# Patient Record
Sex: Female | Born: 1935
Health system: Southern US, Community
[De-identification: ages and names within clinical notes are randomized; demographics above are authoritative.]

## PROBLEM LIST (undated history)

## (undated) DIAGNOSIS — M1990 Unspecified osteoarthritis, unspecified site: Secondary | ICD-10-CM

## (undated) DIAGNOSIS — G459 Transient cerebral ischemic attack, unspecified: Secondary | ICD-10-CM

## (undated) DIAGNOSIS — I10 Essential (primary) hypertension: Secondary | ICD-10-CM

## (undated) DIAGNOSIS — H409 Unspecified glaucoma: Secondary | ICD-10-CM

## (undated) DIAGNOSIS — T8859XA Other complications of anesthesia, initial encounter: Secondary | ICD-10-CM

## (undated) DIAGNOSIS — T4145XA Adverse effect of unspecified anesthetic, initial encounter: Secondary | ICD-10-CM

## (undated) DIAGNOSIS — E785 Hyperlipidemia, unspecified: Secondary | ICD-10-CM

## (undated) HISTORY — PX: TONSILLECTOMY: SUR1361

---

## 2000-05-03 ENCOUNTER — Ambulatory Visit (HOSPITAL_COMMUNITY): Admission: RE | Admit: 2000-05-03 | Discharge: 2000-05-03 | Payer: Self-pay | Admitting: Family Medicine

## 2000-05-03 ENCOUNTER — Encounter: Payer: Self-pay | Admitting: Family Medicine

## 2000-05-04 ENCOUNTER — Other Ambulatory Visit: Admission: RE | Admit: 2000-05-04 | Discharge: 2000-05-04 | Payer: Self-pay | Admitting: Family Medicine

## 2004-08-16 ENCOUNTER — Encounter (HOSPITAL_COMMUNITY): Admission: RE | Admit: 2004-08-16 | Discharge: 2004-09-15 | Payer: Self-pay | Admitting: Neurological Surgery

## 2007-01-04 HISTORY — PX: EYE SURGERY: SHX253

## 2007-02-06 ENCOUNTER — Ambulatory Visit (HOSPITAL_COMMUNITY): Admission: RE | Admit: 2007-02-06 | Discharge: 2007-02-07 | Payer: Self-pay | Admitting: Ophthalmology

## 2010-05-18 NOTE — Op Note (Signed)
NAME:  JIANNA, DRABIK           ACCOUNT NO.:  [id number]   MEDICAL RECORD NO.:  [id number]          PATIENT TYPE:  AMB   LOCATION:  SDS                          FACILITY:  [facility]   PHYSICIAN:  John D. Ashley Royalty, M.D. DATE OF BIRTH:  [birth year]   DATE OF PROCEDURE:  02/06/2007  DATE OF DISCHARGE:                               OPERATIVE REPORT   ADMISSION DIAGNOSIS:  Preretinal fibrosis, right eye, and macular edema,  right eye.   PROCEDURES:  Pars plana vitrectomy, right eye, retinal photocoagulation,  right eye, intravitreal Kenalog injection, right eye, membrane peel,  right eye.   SURGEON:  Beulah Gandy. Ashley Royalty, M.D.   ASSISTANT:  Rosalie Doctor, MA   ANESTHESIA:  General.   DETAILS:  Usual prep and drape. The indirect ophthalmoscope laser was  moved into place. Two rows of laser were placed in the retinal periphery  with a power of 320 milliwatts, 398 burns were placed, 1000 microns each  and 0.1 seconds each.  Attention was carried to the pars plana area  where sclerotomies were made at 8, 10 and 2 o'clock.  The 5 mm infusion  port was anchored into place at 8 o'clock. The lighted pick and the  cutter placed at 10 and 2 o'clock respectively.  Contact lens ring was  anchored into place at 6 and 12 o'clock.  Provisc was placed on the  corneal surface and flat contact lens was placed.  Pars plana vitrectomy  was begun just behind the crystalline lens.  Large sheets of vitreous  material were encountered and carefully removed with the vitreous  cutter, the diamond dusted membrane scraper, the retinal pick, and ILM  forceps.  The diamond dusted membrane scraper was used to sweep around  the macular region.  All surface membranes were removed.  The membrane  was removed from its attachment to the disk. The vitrectomy was carried  out to the far periphery with a 30 degrees prismatic lens. Vitreous was  trimmed down to the vitreous base for 360 degrees. All particles were  removed from  the vitreous then intravitreal Kenalog, 0.1 mL, was  injected into the vitreous cavity.  The instruments were removed from  the eye and 9-0 nylon was used to close the sclerotomy sites.  The  conjunctiva was closed with wet field cautery.  Polymyxin and gentamicin  were irrigated into tenon's space.  Marcaine was injected around the  globe for postop pain. Decadron 10 mg was injected to the lower  subconjunctival space.  Closing pressure was 15 with a Museum/gallery exhibitions officer.  TobraDex ophthalmic ointment, a patch and shield were  placed.  The patient was awakened and taken to recovery in satisfactory  condition.   COMPLICATIONS:  None.   DURATION:  One hour.     Beulah Gandy. Ashley Royalty, M.D.  Electronically Signed    JDM/MEDQ  D:  02/06/2007  T:  02/06/2007  Job:  [id number]

## 2010-09-24 LAB — CBC
HCT: 38.1
Hemoglobin: 13.1
MCHC: 34.5
MCV: 89.1
Platelets: 290
RBC: 4.27
RDW: 12.9
WBC: 6.8

## 2010-09-24 LAB — BASIC METABOLIC PANEL
BUN: 13
CO2: 29
Calcium: 9.5
Chloride: 105
Creatinine, Ser: 0.69
GFR calc Af Amer: >60
GFR calc non Af Amer: >60
Glucose, Bld: 101 — ABNORMAL HIGH
Potassium: 4.2
Sodium: 140

## 2011-03-24 ENCOUNTER — Encounter (INDEPENDENT_AMBULATORY_CARE_PROVIDER_SITE_OTHER): Payer: Medicare Other | Admitting: Ophthalmology

## 2011-03-24 DIAGNOSIS — H35379 Puckering of macula, unspecified eye: Secondary | ICD-10-CM

## 2011-03-24 DIAGNOSIS — H3530 Unspecified macular degeneration: Secondary | ICD-10-CM

## 2011-03-24 DIAGNOSIS — H43819 Vitreous degeneration, unspecified eye: Secondary | ICD-10-CM

## 2011-05-05 ENCOUNTER — Encounter (INDEPENDENT_AMBULATORY_CARE_PROVIDER_SITE_OTHER): Payer: Medicare Other | Admitting: Ophthalmology

## 2011-05-05 DIAGNOSIS — H43819 Vitreous degeneration, unspecified eye: Secondary | ICD-10-CM

## 2011-05-05 DIAGNOSIS — H35379 Puckering of macula, unspecified eye: Secondary | ICD-10-CM

## 2011-05-05 DIAGNOSIS — H35359 Cystoid macular degeneration, unspecified eye: Secondary | ICD-10-CM

## 2011-05-18 ENCOUNTER — Ambulatory Visit (INDEPENDENT_AMBULATORY_CARE_PROVIDER_SITE_OTHER): Payer: Medicare Other | Admitting: Ophthalmology

## 2011-05-18 DIAGNOSIS — H26499 Other secondary cataract, unspecified eye: Secondary | ICD-10-CM

## 2011-06-01 ENCOUNTER — Ambulatory Visit (INDEPENDENT_AMBULATORY_CARE_PROVIDER_SITE_OTHER): Payer: Medicare Other | Admitting: Ophthalmology

## 2011-06-01 DIAGNOSIS — H2700 Aphakia, unspecified eye: Secondary | ICD-10-CM

## 2011-07-18 ENCOUNTER — Encounter (INDEPENDENT_AMBULATORY_CARE_PROVIDER_SITE_OTHER): Payer: Medicare Other | Admitting: Ophthalmology

## 2011-07-18 DIAGNOSIS — H35359 Cystoid macular degeneration, unspecified eye: Secondary | ICD-10-CM

## 2011-07-18 DIAGNOSIS — H3530 Unspecified macular degeneration: Secondary | ICD-10-CM

## 2011-07-18 DIAGNOSIS — H43819 Vitreous degeneration, unspecified eye: Secondary | ICD-10-CM

## 2011-07-18 DIAGNOSIS — H35379 Puckering of macula, unspecified eye: Secondary | ICD-10-CM

## 2011-08-01 ENCOUNTER — Other Ambulatory Visit (HOSPITAL_COMMUNITY): Payer: Self-pay | Admitting: Pediatrics

## 2011-08-01 DIAGNOSIS — G459 Transient cerebral ischemic attack, unspecified: Secondary | ICD-10-CM

## 2011-08-02 ENCOUNTER — Ambulatory Visit (HOSPITAL_COMMUNITY)
Admission: RE | Admit: 2011-08-02 | Discharge: 2011-08-02 | Disposition: A | Discharge status: Home / Self Care | Payer: Medicare Other | Source: Ambulatory Visit | Attending: Pediatrics | Admitting: Pediatrics

## 2011-08-02 DIAGNOSIS — G459 Transient cerebral ischemic attack, unspecified: Secondary | ICD-10-CM | POA: Insufficient documentation

## 2011-08-02 DIAGNOSIS — I6529 Occlusion and stenosis of unspecified carotid artery: Secondary | ICD-10-CM | POA: Insufficient documentation

## 2011-09-19 ENCOUNTER — Encounter (INDEPENDENT_AMBULATORY_CARE_PROVIDER_SITE_OTHER): Payer: Medicare Other | Admitting: Ophthalmology

## 2011-09-19 DIAGNOSIS — H3530 Unspecified macular degeneration: Secondary | ICD-10-CM

## 2011-09-19 DIAGNOSIS — H35359 Cystoid macular degeneration, unspecified eye: Secondary | ICD-10-CM

## 2011-09-19 DIAGNOSIS — H43819 Vitreous degeneration, unspecified eye: Secondary | ICD-10-CM

## 2011-09-20 ENCOUNTER — Encounter (HOSPITAL_BASED_OUTPATIENT_CLINIC_OR_DEPARTMENT_OTHER): Payer: Self-pay | Admitting: *Deleted

## 2011-09-20 NOTE — Progress Notes (Signed)
To come in for [id number]  

## 2011-09-23 ENCOUNTER — Encounter (HOSPITAL_BASED_OUTPATIENT_CLINIC_OR_DEPARTMENT_OTHER)
Admission: RE | Admit: 2011-09-23 | Discharge: 2011-09-23 | Disposition: A | Discharge status: Home / Self Care | Payer: Medicare Other | Source: Ambulatory Visit | Attending: Orthopedic Surgery | Admitting: Orthopedic Surgery

## 2011-09-23 ENCOUNTER — Other Ambulatory Visit: Payer: Self-pay | Admitting: Orthopedic Surgery

## 2011-09-23 LAB — BASIC METABOLIC PANEL
CO2: 26 mEq/L (ref 19–32)
Calcium: 9.3 mg/dL (ref 8.4–10.5)
Creatinine, Ser: 0.63 mg/dL (ref 0.50–1.10)
Glucose, Bld: 97 mg/dL (ref 70–99)

## 2011-09-27 ENCOUNTER — Encounter (HOSPITAL_BASED_OUTPATIENT_CLINIC_OR_DEPARTMENT_OTHER)
Admission: RE | Disposition: A | Discharge status: Home / Self Care | Payer: Self-pay | Source: Ambulatory Visit | Attending: Orthopedic Surgery

## 2011-09-27 ENCOUNTER — Ambulatory Visit (HOSPITAL_BASED_OUTPATIENT_CLINIC_OR_DEPARTMENT_OTHER)
Admission: RE | Admit: 2011-09-27 | Discharge: 2011-09-27 | Disposition: A | Discharge status: Home / Self Care | Payer: Medicare Other | Source: Ambulatory Visit | Attending: Orthopedic Surgery | Admitting: Orthopedic Surgery

## 2011-09-27 ENCOUNTER — Encounter (HOSPITAL_BASED_OUTPATIENT_CLINIC_OR_DEPARTMENT_OTHER): Payer: Self-pay | Admitting: Orthopedic Surgery

## 2011-09-27 ENCOUNTER — Encounter (HOSPITAL_BASED_OUTPATIENT_CLINIC_OR_DEPARTMENT_OTHER): Payer: Self-pay | Admitting: Certified Registered"

## 2011-09-27 ENCOUNTER — Encounter (HOSPITAL_BASED_OUTPATIENT_CLINIC_OR_DEPARTMENT_OTHER): Payer: Self-pay | Admitting: Certified Registered Nurse Anesthetist

## 2011-09-27 ENCOUNTER — Ambulatory Visit (HOSPITAL_BASED_OUTPATIENT_CLINIC_OR_DEPARTMENT_OTHER): Payer: Medicare Other | Admitting: Certified Registered"

## 2011-09-27 DIAGNOSIS — I10 Essential (primary) hypertension: Secondary | ICD-10-CM | POA: Insufficient documentation

## 2011-09-27 DIAGNOSIS — M65839 Other synovitis and tenosynovitis, unspecified forearm: Secondary | ICD-10-CM | POA: Insufficient documentation

## 2011-09-27 DIAGNOSIS — Z8673 Personal history of transient ischemic attack (TIA), and cerebral infarction without residual deficits: Secondary | ICD-10-CM | POA: Insufficient documentation

## 2011-09-27 DIAGNOSIS — E785 Hyperlipidemia, unspecified: Secondary | ICD-10-CM | POA: Insufficient documentation

## 2011-09-27 DIAGNOSIS — Z01812 Encounter for preprocedural laboratory examination: Secondary | ICD-10-CM | POA: Insufficient documentation

## 2011-09-27 DIAGNOSIS — Z01810 Encounter for preprocedural cardiovascular examination: Secondary | ICD-10-CM | POA: Insufficient documentation

## 2011-09-27 DIAGNOSIS — M6530 Trigger finger, unspecified finger: Secondary | ICD-10-CM | POA: Insufficient documentation

## 2011-09-27 DIAGNOSIS — M65849 Other synovitis and tenosynovitis, unspecified hand: Secondary | ICD-10-CM | POA: Insufficient documentation

## 2011-09-27 HISTORY — DX: Essential (primary) hypertension: I10

## 2011-09-27 HISTORY — DX: Other complications of anesthesia, initial encounter: T88.59XA

## 2011-09-27 HISTORY — DX: Hyperlipidemia, unspecified: E78.5

## 2011-09-27 HISTORY — DX: Adverse effect of unspecified anesthetic, initial encounter: T41.45XA

## 2011-09-27 HISTORY — DX: Unspecified osteoarthritis, unspecified site: M19.90

## 2011-09-27 HISTORY — PX: TRIGGER FINGER RELEASE: SHX641

## 2011-09-27 HISTORY — DX: Transient cerebral ischemic attack, unspecified: G45.9

## 2011-09-27 HISTORY — DX: Unspecified glaucoma: H40.9

## 2011-09-27 SURGERY — RELEASE, A1 PULLEY, FOR TRIGGER FINGER
Anesthesia: Regional | Site: Finger | Laterality: Left | Wound class: Clean

## 2011-09-27 MED ORDER — BUPIVACAINE HCL (PF) 0.25 % IJ SOLN
INTRAMUSCULAR | Status: DC | PRN
  Administered 2011-09-27: 5 mL

## 2011-09-27 MED ORDER — HYDROMORPHONE HCL PF 1 MG/ML IJ SOLN: 0.2500 mg | INTRAMUSCULAR | Status: DC | PRN

## 2011-09-27 MED ORDER — HYDROCODONE-ACETAMINOPHEN 5-500 MG PO TABS: 1.0000 | ORAL_TABLET | ORAL | Status: AC | PRN

## 2011-09-27 MED ORDER — LIDOCAINE HCL (CARDIAC) 20 MG/ML IV SOLN
INTRAVENOUS | Status: DC | PRN
  Administered 2011-09-27: 30 mg via INTRAVENOUS

## 2011-09-27 MED ORDER — ONDANSETRON HCL 4 MG/2ML IJ SOLN
INTRAMUSCULAR | Status: DC | PRN
  Administered 2011-09-27: 4 mg via INTRAVENOUS

## 2011-09-27 MED ORDER — OXYCODONE HCL 5 MG/5ML PO SOLN: 5.0000 mg | Freq: Once | ORAL | Status: DC | PRN

## 2011-09-27 MED ORDER — OXYCODONE HCL 5 MG PO TABS: 5.0000 mg | ORAL_TABLET | Freq: Once | ORAL | Status: DC | PRN

## 2011-09-27 MED ORDER — CEFAZOLIN SODIUM-DEXTROSE 2-3 GM-% IV SOLR
2.0000 g | INTRAVENOUS | Status: AC
  Administered 2011-09-27: 2 g via INTRAVENOUS

## 2011-09-27 MED ORDER — DROPERIDOL 2.5 MG/ML IJ SOLN: 0.6250 mg | INTRAMUSCULAR | Status: DC | PRN

## 2011-09-27 MED ORDER — PROPOFOL 10 MG/ML IV EMUL
INTRAVENOUS | Status: DC | PRN
  Administered 2011-09-27: 25 ug/kg/min via INTRAVENOUS

## 2011-09-27 MED ORDER — CHLORHEXIDINE GLUCONATE 4 % EX LIQD: 60.0000 mL | Freq: Once | CUTANEOUS | Status: DC

## 2011-09-27 MED ORDER — LIDOCAINE HCL (PF) 0.5 % IJ SOLN
INTRAMUSCULAR | Status: DC | PRN
  Administered 2011-09-27: 30 mL via INTRAVENOUS

## 2011-09-27 MED ORDER — LACTATED RINGERS IV SOLN
INTRAVENOUS | Status: DC
  Administered 2011-09-27: 09:00:00 via INTRAVENOUS

## 2011-09-27 SURGICAL SUPPLY — 38 items
BANDAGE COBAN STERILE 2 (GAUZE/BANDAGES/DRESSINGS) ×2 IMPLANT
BLADE SURG 15 STRL LF DISP TIS (BLADE) ×1 IMPLANT
BLADE SURG 15 STRL SS (BLADE) ×2
BNDG CMPR 9X4 STRL LF SNTH (GAUZE/BANDAGES/DRESSINGS)
BNDG ESMARK 4X9 LF (GAUZE/BANDAGES/DRESSINGS) IMPLANT
CHLORAPREP W/TINT 26ML (MISCELLANEOUS) ×2 IMPLANT
CLOTH BEACON ORANGE TIMEOUT ST (SAFETY) ×2 IMPLANT
CORDS BIPOLAR (ELECTRODE) IMPLANT
COVER MAYO STAND STRL (DRAPES) ×2 IMPLANT
COVER TABLE BACK 60X90 (DRAPES) ×2 IMPLANT
CUFF TOURNIQUET SINGLE 18IN (TOURNIQUET CUFF) ×1 IMPLANT
DECANTER SPIKE VIAL GLASS SM (MISCELLANEOUS) IMPLANT
DRAPE EXTREMITY T 121X128X90 (DRAPE) ×2 IMPLANT
DRAPE SURG 17X23 STRL (DRAPES) ×2 IMPLANT
GAUZE XEROFORM 1X8 LF (GAUZE/BANDAGES/DRESSINGS) ×2 IMPLANT
GLOVE BIO SURGEON STRL SZ 6.5 (GLOVE) ×2 IMPLANT
GLOVE BIO SURGEON STRL SZ7.5 (GLOVE) ×1 IMPLANT
GLOVE BIOGEL PI IND STRL 8 (GLOVE) IMPLANT
GLOVE BIOGEL PI IND STRL 8.5 (GLOVE) IMPLANT
GLOVE BIOGEL PI INDICATOR 8 (GLOVE) ×1
GLOVE BIOGEL PI INDICATOR 8.5 (GLOVE) ×1
GLOVE INDICATOR 7.0 STRL GRN (GLOVE) ×1 IMPLANT
GLOVE SURG ORTHO 8.0 STRL STRW (GLOVE) ×2 IMPLANT
GOWN BRE IMP PREV XXLGXLNG (GOWN DISPOSABLE) ×3 IMPLANT
GOWN PREVENTION PLUS XLARGE (GOWN DISPOSABLE) ×2 IMPLANT
NEEDLE 27GAX1X1/2 (NEEDLE) ×1 IMPLANT
NS IRRIG 1000ML POUR BTL (IV SOLUTION) ×2 IMPLANT
PACK BASIN DAY SURGERY FS (CUSTOM PROCEDURE TRAY) ×2 IMPLANT
PADDING CAST ABS 4INX4YD NS (CAST SUPPLIES)
PADDING CAST ABS COTTON 4X4 ST (CAST SUPPLIES) ×1 IMPLANT
SPONGE GAUZE 4X4 12PLY (GAUZE/BANDAGES/DRESSINGS) ×2 IMPLANT
STOCKINETTE 4X48 STRL (DRAPES) ×2 IMPLANT
SUT VICRYL RAPIDE 4/0 PS 2 (SUTURE) ×2 IMPLANT
SYR BULB 3OZ (MISCELLANEOUS) ×2 IMPLANT
SYR CONTROL 10ML LL (SYRINGE) ×1 IMPLANT
TOWEL OR 17X24 6PK STRL BLUE (TOWEL DISPOSABLE) ×4 IMPLANT
UNDERPAD 30X30 INCONTINENT (UNDERPADS AND DIAPERS) ×2 IMPLANT
WATER STERILE IRR 1000ML POUR (IV SOLUTION) ×1 IMPLANT

## 2011-09-27 NOTE — Transfer of Care (Signed)
Immediate Anesthesia Transfer of Care Note  Patient: Theresa Morris  Procedure(s) Performed: Procedure(s) (LRB) with comments: RELEASE TRIGGER FINGER/A-1 PULLEY (Left) - Left middle finger  Patient Location: PACU  Anesthesia Type: Bier block  Level of Consciousness: awake, alert , oriented and patient cooperative  Airway & Oxygen Therapy: Patient Spontanous Breathing and Patient connected to face mask oxygen  Post-op Assessment: Report given to PACU RN and Post -op Vital signs reviewed and stable  Post vital signs: Reviewed and stable  Complications: No apparent anesthesia complications

## 2011-09-27 NOTE — Anesthesia Procedure Notes (Signed)
Procedure Name: MAC Date/Time: 09/27/2011 9:32 AM Performed by: Braydan Marriott D Pre-anesthesia Checklist: Patient identified, Emergency Drugs available, Suction available, Patient being monitored and Timeout performed Patient Re-evaluated:Patient Re-evaluated prior to inductionOxygen Delivery Method: Simple face mask

## 2011-09-27 NOTE — H&P (Signed)
Theresa Morris is a 78 year-old left-hand dominant female who comes in complaining of catching of her left middle finger.  This has been going on for approximately 8 months.  She did a great deal of pruning in June.  She noticed it approximately two weeks later.  She has no history of diabetes, thyroid problems, arthritis or gout.  She is not complaining of any numbness or tingling. She is not actually complaining of any significant pain or discomfort. She has had recurrence of her STS left middle finger. She recently had a TIA and was placed on aspirin. She apparently did not need a carotid endarterectomy.   ALLERGIES: None.    MEDICATIONS:   None.  SURGICAL HISTORY:   Eye surgery in 2008.     FAMILY MEDICAL HISTORY:    Negative.  SOCIAL HISTORY:   She does not smoke or drink.   She is married. She is a retired Runner, broadcasting/film/video.  REVIEW OF SYSTEMS:     Negative 14 points.   Theresa Morris is an 78 y.o. female.   Chief Complaint: STS LMF HPI: see above  Past Medical History  Diagnosis Date  . Hypertension   . Hyperlipemia   . Glaucoma   . TIA (transient ischemic attack)     had a finger go limp-was told tia.  . Arthritis   . Complication of anesthesia     was told she had a small airway    Past Surgical History  Procedure Date  . Eye surgery 2009    rt eye  . Tonsillectomy   . Cesarean section     No family history on file. Social History:  reports that she has never smoked. She does not have any smokeless tobacco history on file. She reports that she does not drink alcohol or use illicit drugs.  Allergies: No Known Allergies  Medications Prior to Admission  Medication Sig Dispense Refill  . aspirin 325 MG tablet Take 325 mg by mouth daily.      Marland Kitchen lisinopril-hydrochlorothiazide (PRINZIDE,ZESTORETIC) 10-12.5 MG per tablet Take 1 tablet by mouth daily.      . Multiple Vitamins-Minerals (MULTIVITAMIN WITH MINERALS) tablet Take 1 tablet by mouth daily.      . pravastatin  (PRAVACHOL) 40 MG tablet Take 40 mg by mouth daily.        No results found for this or any previous visit (from the past 48 hour(s)).  No results found.   Pertinent items are noted in HPI.  Height 5\' 1"  (1.549 m), weight 62.143 kg (137 lb).  General appearance: alert, cooperative and appears stated age Head: Normocephalic, without obvious abnormality Neck: no adenopathy Resp: clear to auscultation bilaterally Cardio: regular rate and rhythm, S1, S2 normal, no murmur, click, rub or gallop GI: soft, non-tender; bowel sounds normal; no masses,  no organomegaly Extremities: extremities normal, atraumatic, no cyanosis or edema Pulses: 2+ and symmetric Skin: Skin color, texture, turgor normal. No rashes or lesions Neurologic: Grossly normal Incision/Wound: na  Assessment/Plan DX STS LMF  She desires proceeding to have the A-1 pulley released. The pre, peri and post op course are discussed along with risks and complications.  She is aware there is no guarantee with surgery, possibility of infection, recurrence, injury to arteries, nerves and tendons, incomplete relief of symptoms and dystrophy.  She is scheduled for release A-1 pulley left middle finger as an outpatient. This will be done under forearm based IV regional or local infiltration.  Ashantia Amaral R 09/27/2011, 8:38 AM

## 2011-09-27 NOTE — Brief Op Note (Signed)
09/27/2011  10:01 AM  PATIENT:  Theresa Morris  78 y.o. female  PRE-OPERATIVE DIAGNOSIS:  stenosing tenosynovitis left middle finger  POST-OPERATIVE DIAGNOSIS:  stenosing tenosynovitis left middle finger  PROCEDURE:  Procedure(s) (LRB) with comments: RELEASE TRIGGER FINGER/A-1 PULLEY (Left) - Left middle finger  SURGEON:  Surgeon(s) and Role:    * Nicki Reaper, MD - Primary  PHYSICIAN ASSISTANT:   ASSISTANTS: Karlyn Agee, MD   ANESTHESIA:   local and regional  EBL:  Total I/O In: 500 [I.V.:500] Out: -   BLOOD ADMINISTERED:none  DRAINS: none   LOCAL MEDICATIONS USED:  MARCAINE     SPECIMEN:  No Specimen  DISPOSITION OF SPECIMEN:  N/A  COUNTS:  YES  TOURNIQUET:   Total Tourniquet Time Documented: Forearm (Left) - 15 minutes  DICTATION: .Other Dictation: Dictation Number [id number]  PLAN OF CARE: Discharge to home after PACU  PATIENT DISPOSITION:  PACU - hemodynamically stable.

## 2011-09-27 NOTE — Anesthesia Preprocedure Evaluation (Addendum)
Anesthesia Evaluation  Patient identified by MRN, date of birth, ID band Patient awake    Reviewed: Allergy & Precautions, H&P , NPO status , Patient's Chart, lab work & pertinent test results  History of Anesthesia Complications (+) DIFFICULT AIRWAY  Airway Mallampati: I  Neck ROM: Full    Dental  (+) Dental Advisory Given   Pulmonary neg pulmonary ROS,  breath sounds clear to auscultation  Pulmonary exam normal       Cardiovascular hypertension, Pt. on medications Rhythm:Regular     Neuro/Psych TIA   GI/Hepatic negative GI ROS, Neg liver ROS,   Endo/Other  negative endocrine ROS  Renal/GU negative Renal ROS     Musculoskeletal   Abdominal   Peds  Hematology   Anesthesia Other Findings   Reproductive/Obstetrics                           Anesthesia Physical Anesthesia Plan  ASA: III  Anesthesia Plan: MAC and Bier Block   Post-op Pain Management:    Induction:   Airway Management Planned: Simple Face Mask  Additional Equipment:   Intra-op Plan:   Post-operative Plan:   Informed Consent: I have reviewed the patients History and Physical, chart, labs and discussed the procedure including the risks, benefits and alternatives for the proposed anesthesia with the patient or authorized representative who has indicated his/her understanding and acceptance.   Dental advisory given  Plan Discussed with: CRNA, Anesthesiologist and Surgeon  Anesthesia Plan Comments:         Anesthesia Quick Evaluation

## 2011-09-27 NOTE — Op Note (Signed)
Dictation Number [id number]

## 2011-09-27 NOTE — Anesthesia Postprocedure Evaluation (Signed)
Anesthesia Post Note  Patient: Theresa Morris  Procedure(s) Performed: Procedure(s) (LRB): RELEASE TRIGGER FINGER/A-1 PULLEY (Left)  Anesthesia type: MAC  Patient location: PACU  Post pain: Pain level controlled  Post assessment: Patient's Cardiovascular Status Stable  Last Vitals:  Filed Vitals:   09/27/11 1030  BP: 155/61  Pulse: 53  Temp:   Resp: 12    Post vital signs: Reviewed and stable  Level of consciousness: sedated  Complications: No apparent anesthesia complications

## 2011-09-28 ENCOUNTER — Encounter (HOSPITAL_BASED_OUTPATIENT_CLINIC_OR_DEPARTMENT_OTHER): Payer: Self-pay | Admitting: Orthopedic Surgery

## 2011-09-28 NOTE — Op Note (Signed)
NAME:  Theresa Morris, SPATH           ACCOUNT NO.:  [id number]  MEDICAL RECORD NO.:  [id number]  LOCATION:                                 FACILITY:  PHYSICIAN:  Cindee Salt, M.D.       DATE OF BIRTH:  1931/05/30  DATE OF PROCEDURE:  09/27/2011 DATE OF DISCHARGE:                              OPERATIVE REPORT   PREOPERATIVE DIAGNOSIS:  Stenosing tenosynovitis, left middle finger.  POSTOPERATIVE DIAGNOSIS:  Stenosing tenosynovitis, left middle finger.  OPERATION:  Release of A1 pulley, left middle finger.  SURGEON:  Cindee Salt, MD  ASSISTANT:  Betha Loa, MD  ANESTHESIA:  Forearm-based IV regional with local infiltration.  ANESTHESIOLOGIST:  Quita Skye. Krista Blue, MD.  HISTORY:  The patient is a 78 year old female with a history of triggering of her left middle finger and nonresponsive to conservative treatment.  She is elected to undergo surgical release of the A1 pulley. Pre, peri, and postoperative course have been discussed along with risks and complications.  She is aware that there is no guarantee with the surgery, possibility of infection; recurrence of injury to arteries, nerves, tendons, incomplete relief of symptoms, dystrophy.  In the preoperative area, the patient is seen.  The extremity marked by both the patient and surgeon.  Antibiotic given.  PROCEDURE:  The patient was brought to the operating room where a forearm-based IV regional anesthetic was carried out without difficulty. She was prepped using ChloraPrep, supine position, left arm free.  A 3- minute dry time was allowed.  Time-out taken confirming the patient and procedure.  An oblique incision was made over the A1 pulley of the left middle finger, carried down through subcutaneous tissue.  Bleeders were electrocauterized with bipolar.  The dissection was carried down.  The A1 pulley was noted to be quite thickened and this was released on its radial aspect with retractors placed protecting  neurovascular structures.  Small incision was made centrally in A2.  A partial tenosynovectomy performed proximally.  The finger placed through a full range motion, no further triggering was noted.  The wound was irrigated and the skin was closed with interrupted 4-0 Vicryl Rapide sutures. Local infiltration with 0.25% Marcaine without epinephrine was given, 6 mL was used.  A sterile compressive dressing with fingers free was applied.  On deflation of the tourniquet, all fingers immediately pinked.  She was taken to the recovery room for observation in satisfactory condition.  She will be discharged home to return to [facility] in 1 week, on Vicodin.          ______________________________ Cindee Salt, M.D.     GK/MEDQ  D:  09/27/2011  T:  09/28/2011  Job:  [id number]

## 2011-12-19 ENCOUNTER — Encounter (INDEPENDENT_AMBULATORY_CARE_PROVIDER_SITE_OTHER): Payer: Medicare Other | Admitting: Ophthalmology

## 2011-12-19 DIAGNOSIS — H35379 Puckering of macula, unspecified eye: Secondary | ICD-10-CM

## 2011-12-19 DIAGNOSIS — H43819 Vitreous degeneration, unspecified eye: Secondary | ICD-10-CM

## 2011-12-19 DIAGNOSIS — H35359 Cystoid macular degeneration, unspecified eye: Secondary | ICD-10-CM

## 2011-12-19 DIAGNOSIS — H3530 Unspecified macular degeneration: Secondary | ICD-10-CM

## 2012-03-14 ENCOUNTER — Encounter (INDEPENDENT_AMBULATORY_CARE_PROVIDER_SITE_OTHER): Payer: Medicare Other | Admitting: Ophthalmology

## 2012-03-14 DIAGNOSIS — H35359 Cystoid macular degeneration, unspecified eye: Secondary | ICD-10-CM

## 2012-03-14 DIAGNOSIS — H43819 Vitreous degeneration, unspecified eye: Secondary | ICD-10-CM

## 2012-03-14 DIAGNOSIS — H35379 Puckering of macula, unspecified eye: Secondary | ICD-10-CM

## 2012-03-14 DIAGNOSIS — H3530 Unspecified macular degeneration: Secondary | ICD-10-CM

## 2012-03-19 ENCOUNTER — Encounter (INDEPENDENT_AMBULATORY_CARE_PROVIDER_SITE_OTHER): Payer: Medicare Other | Admitting: Ophthalmology

## 2012-06-11 ENCOUNTER — Encounter (INDEPENDENT_AMBULATORY_CARE_PROVIDER_SITE_OTHER): Payer: Medicare Other | Admitting: Ophthalmology

## 2012-06-11 DIAGNOSIS — H43819 Vitreous degeneration, unspecified eye: Secondary | ICD-10-CM

## 2012-06-11 DIAGNOSIS — H35359 Cystoid macular degeneration, unspecified eye: Secondary | ICD-10-CM

## 2012-06-11 DIAGNOSIS — H3530 Unspecified macular degeneration: Secondary | ICD-10-CM

## 2012-06-11 DIAGNOSIS — H35379 Puckering of macula, unspecified eye: Secondary | ICD-10-CM

## 2012-11-12 ENCOUNTER — Encounter (INDEPENDENT_AMBULATORY_CARE_PROVIDER_SITE_OTHER): Payer: Medicare Other | Admitting: Ophthalmology

## 2012-11-12 DIAGNOSIS — H43819 Vitreous degeneration, unspecified eye: Secondary | ICD-10-CM

## 2012-11-12 DIAGNOSIS — H35379 Puckering of macula, unspecified eye: Secondary | ICD-10-CM

## 2012-11-12 DIAGNOSIS — H3530 Unspecified macular degeneration: Secondary | ICD-10-CM

## 2012-11-12 DIAGNOSIS — H35359 Cystoid macular degeneration, unspecified eye: Secondary | ICD-10-CM

## 2013-04-24 ENCOUNTER — Ambulatory Visit (INDEPENDENT_AMBULATORY_CARE_PROVIDER_SITE_OTHER): Payer: Medicare Other | Admitting: Ophthalmology

## 2013-05-03 ENCOUNTER — Ambulatory Visit (INDEPENDENT_AMBULATORY_CARE_PROVIDER_SITE_OTHER): Payer: Medicare Other | Admitting: Ophthalmology

## 2013-05-03 DIAGNOSIS — H35359 Cystoid macular degeneration, unspecified eye: Secondary | ICD-10-CM

## 2013-05-03 DIAGNOSIS — H35379 Puckering of macula, unspecified eye: Secondary | ICD-10-CM

## 2013-05-03 DIAGNOSIS — H3530 Unspecified macular degeneration: Secondary | ICD-10-CM

## 2013-05-03 DIAGNOSIS — H43819 Vitreous degeneration, unspecified eye: Secondary | ICD-10-CM

## 2013-10-23 ENCOUNTER — Encounter: Payer: Self-pay | Admitting: Vascular Surgery

## 2013-10-23 ENCOUNTER — Other Ambulatory Visit: Payer: Self-pay | Admitting: *Deleted

## 2013-10-23 DIAGNOSIS — I70219 Atherosclerosis of native arteries of extremities with intermittent claudication, unspecified extremity: Secondary | ICD-10-CM

## 2013-11-04 ENCOUNTER — Encounter: Payer: Self-pay | Admitting: Family

## 2013-11-05 ENCOUNTER — Encounter (INDEPENDENT_AMBULATORY_CARE_PROVIDER_SITE_OTHER): Payer: Self-pay

## 2013-11-05 ENCOUNTER — Ambulatory Visit (INDEPENDENT_AMBULATORY_CARE_PROVIDER_SITE_OTHER): Payer: Medicare Other | Admitting: Vascular Surgery

## 2013-11-05 ENCOUNTER — Ambulatory Visit (HOSPITAL_COMMUNITY)
Admission: RE | Admit: 2013-11-05 | Discharge: 2013-11-05 | Disposition: A | Discharge status: Home / Self Care | Payer: Medicare Other | Source: Ambulatory Visit | Attending: Vascular Surgery | Admitting: Vascular Surgery

## 2013-11-05 ENCOUNTER — Ambulatory Visit (INDEPENDENT_AMBULATORY_CARE_PROVIDER_SITE_OTHER)
Admission: RE | Admit: 2013-11-05 | Discharge: 2013-11-05 | Disposition: A | Discharge status: Home / Self Care | Payer: Medicare Other | Source: Ambulatory Visit | Attending: Vascular Surgery | Admitting: Vascular Surgery

## 2013-11-05 ENCOUNTER — Encounter: Payer: Self-pay | Admitting: Vascular Surgery

## 2013-11-05 VITALS — BP 164/58 | HR 69 | Resp 18 | Ht 60.5 in | Wt 134.4 lb

## 2013-11-05 DIAGNOSIS — I739 Peripheral vascular disease, unspecified: Secondary | ICD-10-CM | POA: Diagnosis not present

## 2013-11-05 DIAGNOSIS — I70219 Atherosclerosis of native arteries of extremities with intermittent claudication, unspecified extremity: Secondary | ICD-10-CM

## 2013-11-05 NOTE — Addendum Note (Signed)
Addended by: Sharee PimpleMCCHESNEY, Yeudiel Mateo K on: 11/05/2013 04:26 PM   Modules accepted: Orders

## 2013-11-05 NOTE — Progress Notes (Signed)
Patient name: Theresa PigeonFlorene H Hollin MRN: [id number] DOB: 1931-12-19 Sex: female   Referred by: Margo AyeHall  Reason for referral:  Chief Complaint  Patient presents with  . New Evaluation    referred by Dwana MelenaZack Hall   prior abnormal screenings for abis and carotid   . PVD    HISTORY OF PRESENT ILLNESS: Patient presents today for valuation of diffuse peripheral vascular occlusive disease. This is also in follow-up of [facility] screening suggesting possible carotid and possible lower extremity arterial occlusive disease. She is extremely active and very healthy for 80 year old. She does have one event where she reports that her left index finger was week several years ago. She did not seek any evaluation is had no recurrent episodes of this since this one single event. She is left-handed. She specifically denies any other focal deficits and has had no visual or speech changes. She does report that when she is actively walking especially uphill she will have a tired achy sensation in her anterior thighs which is relieved with a very brief resting.. She has no lower extremity tissue loss and no calf claudication symptoms.  Past Medical History  Diagnosis Date  . Hypertension   . Hyperlipemia   . Glaucoma   . TIA (transient ischemic attack)     had a finger go limp-was told tia.  . Arthritis   . Complication of anesthesia     was told she had a small airway    Past Surgical History  Procedure Laterality Date  . Eye surgery  2009    rt eye  . Tonsillectomy    . Cesarean section    . Trigger finger release  09/27/2011    Procedure: RELEASE TRIGGER FINGER/A-1 PULLEY;  Surgeon: Nicki ReaperGary R Kuzma, MD;  Location: [facility];  Service: Orthopedics;  Laterality: Left;  Left middle finger    History   Social History  . Marital Status: Married    Spouse Name: N/A    Number of Children: N/A  . Years of Education: N/A   Occupational History  . Not on file.   Social History Main  Topics  . Smoking status: Never Smoker   . Smokeless tobacco: Not on file  . Alcohol Use: No  . Drug Use: No  . Sexual Activity: Not on file   Other Topics Concern  . Not on file   Social History Narrative    History reviewed. No pertinent family history.  Allergies as of 11/05/2013  . (No Known Allergies)    Current Outpatient Prescriptions on File Prior to Visit  Medication Sig Dispense Refill  . aspirin 325 MG tablet Take 81 mg by mouth daily.     Marland Kitchen. lisinopril-hydrochlorothiazide (PRINZIDE,ZESTORETIC) 10-12.5 MG per tablet Take 1 tablet by mouth daily.    . Multiple Vitamins-Minerals (MULTIVITAMIN WITH MINERALS) tablet Take 1 tablet by mouth daily.    . pravastatin (PRAVACHOL) 40 MG tablet Take 40 mg by mouth daily.     No current facility-administered medications on file prior to visit.     REVIEW OF SYSTEMS:  Positives indicated with an "X"  CARDIOVASCULAR:  [ ]  chest pain   [ ]  chest pressure   [ ]  palpitations   [ ]  orthopnea   [ ]  dyspnea on exertion   [x ] claudication   [ ]  rest pain   [ ]  DVT   [ ]  phlebitis PULMONARY:   [ ]  productive cough   [ ]  asthma   [ ]   wheezing NEUROLOGIC:   [ ]  weakness  [ ]  paresthesias  [ ]  aphasia  [ ]  amaurosis  [ ]  dizziness HEMATOLOGIC:   [ ]  bleeding problems   [ ]  clotting disorders MUSCULOSKELETAL:  [ ]  joint pain   [ ]  joint swelling GASTROINTESTINAL: [ ]   blood in stool  [ ]   hematemesis GENITOURINARY:  [ ]   dysuria  [ ]   hematuria PSYCHIATRIC:  [ ]  history of major depression INTEGUMENTARY:  [ ]  rashes  [ ]  ulcers CONSTITUTIONAL:  [ ]  fever   [ ]  chills  PHYSICAL EXAMINATION:  General: The patient is a well-nourished female, in no acute distress. Vital signs are BP 164/58 mmHg  Pulse 69  Resp 18  Ht 5' 0.5" (1.537 m)  Wt 134 lb 6.4 oz (60.963 kg)  BMI 25.81 kg/m2 Pulmonary: There is a good air exchange bilaterally without wheezing or rales.  Musculoskeletal: There are no major deformities.  There is no  significant extremity pain. Neurologic: No focal weakness or paresthesias are detected, Skin: There are no ulcer or rashes noted. Psychiatric: The patient has normal affect. Cardiovascular: There is a regular rate and rhythm without significant murmur appreciated. Carotid arteries without bruits bilaterally No abdominal bruits noted. 1-2+ femoral and 1+ dorsalis pedis pulses bilaterally  [facility] Vascular Lab Studies:  Ordered and Independently Reviewed carotid duplex revealed 40-59% stenosis of the right internal carotid artery and no significant left carotid stenosis.  Lower extremity Doppler studies reveal Index of 0.7 on the right and 0.70 on the left with biphasic tibial waveforms on the right and monophasic on the left  Impression and Plan:  Diffuse peripheral vascular occlusive disease. I had a long discussion with the patient regarding these issues. Explained that she does not have any indication of high-grade carotid stenosis. Explained that the single event with her left index finger years ago as it is difficult to assess. Since she does not have any high-grade stenosis in the carotid and no symptoms in several years certainly would not recommend any further evaluation. Recommend yearly carotid duplex to rule out progression of stenosis and she does notify should she develop any focal deficits  Regarding her lower extremity symptoms her thigh symptoms do sound like typical claudication from his aortoiliac occlusive disease. I explained that this is no risk whatsoever regarding limb viability. She is perfectly happy tolerating this level of claudication and I have recommended continued walking program of observation only. She was reassured with this will see us r in one year for repeat carotid    Breiona Couvillon [facility] [place] Office: [phone]

## 2014-05-16 ENCOUNTER — Ambulatory Visit (INDEPENDENT_AMBULATORY_CARE_PROVIDER_SITE_OTHER): Payer: Medicare Other | Admitting: Ophthalmology

## 2014-11-18 ENCOUNTER — Ambulatory Visit: Payer: Medicare Other | Admitting: Family

## 2014-11-18 ENCOUNTER — Encounter (HOSPITAL_COMMUNITY): Payer: Self-pay

## 2015-06-10 ENCOUNTER — Ambulatory Visit (INDEPENDENT_AMBULATORY_CARE_PROVIDER_SITE_OTHER): Payer: Medicare Other | Admitting: Ophthalmology

## 2015-06-10 DIAGNOSIS — I10 Essential (primary) hypertension: Secondary | ICD-10-CM

## 2015-06-10 DIAGNOSIS — H353131 Nonexudative age-related macular degeneration, bilateral, early dry stage: Secondary | ICD-10-CM | POA: Diagnosis not present

## 2015-06-10 DIAGNOSIS — H35033 Hypertensive retinopathy, bilateral: Secondary | ICD-10-CM | POA: Diagnosis not present

## 2015-06-10 DIAGNOSIS — H43812 Vitreous degeneration, left eye: Secondary | ICD-10-CM | POA: Diagnosis not present

## 2015-06-10 DIAGNOSIS — H35371 Puckering of macula, right eye: Secondary | ICD-10-CM | POA: Diagnosis not present

## 2015-10-02 ENCOUNTER — Ambulatory Visit (HOSPITAL_COMMUNITY)
Admission: RE | Admit: 2015-10-02 | Discharge: 2015-10-02 | Disposition: A | Discharge status: Home / Self Care | Payer: Medicare Other | Source: Ambulatory Visit | Attending: Internal Medicine | Admitting: Internal Medicine

## 2015-10-02 ENCOUNTER — Other Ambulatory Visit (HOSPITAL_COMMUNITY): Payer: Self-pay | Admitting: Internal Medicine

## 2015-10-02 DIAGNOSIS — Z029 Encounter for administrative examinations, unspecified: Secondary | ICD-10-CM | POA: Diagnosis not present

## 2015-10-02 DIAGNOSIS — M25551 Pain in right hip: Secondary | ICD-10-CM

## 2015-10-02 DIAGNOSIS — M79604 Pain in right leg: Secondary | ICD-10-CM

## 2016-01-22 ENCOUNTER — Ambulatory Visit (HOSPITAL_COMMUNITY): Payer: Medicare Other | Attending: Internal Medicine | Admitting: Physical Therapy

## 2016-01-22 ENCOUNTER — Encounter (HOSPITAL_COMMUNITY): Payer: Self-pay | Admitting: Physical Therapy

## 2016-01-22 DIAGNOSIS — R2689 Other abnormalities of gait and mobility: Secondary | ICD-10-CM | POA: Insufficient documentation

## 2016-01-22 DIAGNOSIS — M6281 Muscle weakness (generalized): Secondary | ICD-10-CM | POA: Diagnosis present

## 2016-01-22 DIAGNOSIS — R29898 Other symptoms and signs involving the musculoskeletal system: Secondary | ICD-10-CM | POA: Diagnosis present

## 2016-01-22 NOTE — Therapy (Signed)
[facility] [address] [place], [place], [address] Phone: [phone]   Fax:  [phone]  Physical Therapy Evaluation  Patient Details  Name: Theresa Morris MRN: [id number] Date of Birth: Aug 02, 1931 Referring Provider: Nita Sells, MD  Encounter Date: 01/22/2016      PT End of Session - 01/22/16 1046    Visit Number 1   Number of Visits 13   Date for PT Re-Evaluation 02/12/16   Authorization Type UHC Medicare   Authorization Time Period 01/22/16 to 03/04/16   PT Start Time 0951   PT Stop Time 1032   PT Time Calculation (min) 41 min   Activity Tolerance No increased pain;Patient tolerated treatment well   Behavior During Therapy [facility] for tasks assessed/performed      Past Medical History:  Diagnosis Date  . Arthritis   . Complication of anesthesia    was told she had a small airway  . Glaucoma   . Hyperlipemia   . Hypertension   . TIA (transient ischemic attack)    had a finger go limp-was told tia.    Past Surgical History:  Procedure Laterality Date  . CESAREAN SECTION    . EYE SURGERY  2009   rt eye  . TONSILLECTOMY    . TRIGGER FINGER RELEASE  09/27/2011   Procedure: RELEASE TRIGGER FINGER/A-1 PULLEY;  Surgeon: Nicki Reaper, MD;  Location: [facility];  Service: Orthopedics;  Laterality: Left;  Left middle finger    There were no vitals filed for this visit.       Subjective Assessment - 01/22/16 0957    Subjective Pt reports that she recently had to move her bedroom up stairs and has noticed she has trouble getting up/down without using her hands. This has been over the course of several months, and she notes that her Rt hip started to bother her around Thanksgiving which resulted in a limp. She feels that the issue has gotten better as she has been going to the chiropractor. She has not been back to the chiropractor for the past month or so. She was not very "faithful" with the exercises she was  provided, but she plans to pick back up with these.    Pertinent History TIA, arthritis, glaucoma, HTN, HLD   Limitations Other (comment)  going up/down stairs.    How long can you sit comfortably? unlimited in a comfortable chair   How long can you stand comfortably? improving    How long can you walk comfortably? improving    Patient Stated Goals improve strength   Currently in Pain? No/denies            [facility] PT Assessment - 01/22/16 0001      Assessment   Medical Diagnosis Multiple falls    Referring Provider Nita Sells, MD   Onset Date/Surgical Date --  several months ago.   Next MD Visit none as of now    Prior Therapy None, saw chiropractor     Precautions   Precautions None     Balance Screen   Has the patient fallen in the past 6 months No   Has the patient had a decrease in activity level because of a fear of falling?  No   Is the patient reluctant to leave their home because of a fear of falling?  No     Home Environment   Additional Comments stairs to reach bedroom, 5 steps then landing, followed by 7-8  steps      Prior Function   Level of Independence Independent   Leisure helps care for her husband      Cognition   Overall Cognitive Status Within Functional Limits for tasks assessed     Sensation   Light Touch Appears Intact     Functional Tests   Functional tests Single leg stance     Single Leg Stance   Comments SLS up to 5 sec on each LE, x2 trials.      ROM / Strength   AROM / PROM / Strength AROM;Strength     AROM   AROM Assessment Site Lumbar   Lumbar Extension x10 reps in standing      Strength   Strength Assessment Site Hip;Knee;Ankle   Right/Left Hip Right;Left   Right Hip Flexion 3+/5   Right Hip Extension 4-/5   Right Hip ABduction 3+/5   Left Hip Flexion 3+/5   Left Hip Extension 4-/5   Left Hip ABduction 3+/5   Right/Left Knee Right;Left   Right Knee Extension 5/5   Left Knee Extension 5/5   Right/Left Ankle Right;Left    Right Ankle Dorsiflexion 5/5   Left Ankle Dorsiflexion 5/5     Flexibility   Soft Tissue Assessment /Muscle Length yes   Hamstrings BLE 35 deg, lacking    Quadriceps WNL   Piriformis WNL     Palpation   Spinal mobility hypomobile PA spring testing lower lumbar spine; pain free   SI assessment  none tender with palpation   Palpation comment non tender with palpation along gluteal region,      Special Tests    Special Tests Lumbar   Lumbar Tests Prone Knee Bend Test;Straight Leg Raise;FABER test     Prone Knee Bend Test   Findings Negative     Straight Leg Raise   Findings Negative     Transfers   Five time sit to stand comments  10.2 sec, no UE     Ambulation/Gait   Gait Comments Ascend/descend 4, 6" steps with 1 handrail x4 RT, noting excessive weight shift Lt/Rt duing eccentric lowering      Standardized Balance Assessment   Standardized Balance Assessment Timed Up and Go Test     Timed Up and Go Test   TUG Comments 9 sec, no AD                   [facility] Adult PT Treatment/Exercise - 01/22/16 0001      Exercises   Exercises Knee/Hip     Knee/Hip Exercises: Supine   Bridges 1 set;10 reps;Both   Bridges Limitations red TB around knees to improve glute activation     Knee/Hip Exercises: Sidelying   Clams x10 reps with red TB for HEP demo                PT Education - 01/22/16 1044    Education provided Yes   Education Details eval findings/POC; impact hip weakness can have during daily activity, specifically stair negotiation; HEP initiated   Person(s) Educated Patient   Methods Explanation;Demonstration;Handout   Comprehension Verbalized understanding;Returned demonstration          PT Short Term Goals - 01/22/16 1056      PT SHORT TERM GOAL #1   Title Pt will demo consistency and independence with her HEP, to improve BLE strength and flexibility.   Time 2   Period Weeks   Status New     PT SHORT TERM  GOAL #2   Title Pt will  demo improved BLE strength to atleast 4/5 MMT, to increase safety with stair negotiation at home.    Time 3   Period Weeks   Status New     PT SHORT TERM GOAL #3   Title pt will demo improved single leg balance evident by her ability to maintain SLS on each LE for atleast 10 sec, 2/3 trials.    Time 3   Period Weeks   Status New           PT Long Term Goals - 01/22/16 1059      PT LONG TERM GOAL #1   Title Pt will demo improved BLE strength to atleast 4+/5 MMT to improve safety with functional activity at home and in the community.   Time 6   Period Weeks   Status New     PT LONG TERM GOAL #2   Title Pt will demo improved hamstring flexibility to lacking no more than 25 deg at 90/90 testing, to improve her sitting/standing posture.   Time 6   Period Weeks   Status New     PT LONG TERM GOAL #3   Title Pt will demo improved balance and strength evident by her ability to ascend/descend 4, 6" steps while holding no more than 5# and without noted LOB/unsteadiness, 2/3 trials.    Time 6   Period Weeks   Status New               Plan - 01/22/16 1048    Clinical Impression Statement Pt is a pleasant 84yo F referred to OPPT for multiple falls, however she denies any falls over the past several months. She reports bilateral gluteal pain and weakness with functional activity such as stair negotiation, making it difficult to get up to her bedroom on the 2nd floor. During today's evaluation, her pain was unable to be reproduced with palpation of the back/hip region and special testing of the lumbar spine. She does demonstrate significant weakness in her hip musculature which is evident with her technique during stair negotiation and impaired balance with single leg stance time no greater than 5 sec on each LE. She would benefit from skilled PT to address her limitations listed and to improve her safety/independence with functional activity at home and in the community. Eval findings  were reviewed with the pt and her HEP was initiated. She demonstrated understanding and verbalized agreement with proposed PT POC/frequency at this time.    Rehab Potential Good   PT Frequency 2x / week   PT Duration 6 weeks  decrease to 1x/week as deemed appropriate by evaluating therapist   PT Treatment/Interventions ADLs/Self Care Home Management;Moist Heat;Gait training;Stair training;Functional mobility training;Therapeutic activities;Patient/family education;Neuromuscular re-education;Balance training;Manual techniques;Dry needling;Passive range of motion   PT Next Visit Plan hip strengthening: hip extensors/abductors/hamstrings; hamstring stretch, attempt lateral step downs with 4" box to see if quad control improves   PT Home Exercise Plan supine bridge with red TB around knees 2x15 reps, sidelying clamshells 2x10 reps with red TB   Recommended Other Services none    Consulted and Agree with Plan of Care Patient      Patient will benefit from skilled therapeutic intervention in order to improve the following deficits and impairments:  Decreased activity tolerance, Decreased balance, Decreased endurance, Decreased strength, Impaired flexibility, Improper body mechanics, Pain, Postural dysfunction, Decreased mobility  Visit Diagnosis: Muscle weakness (generalized)  Other abnormalities of gait and mobility  Other symptoms  and signs involving the musculoskeletal system      G-Codes - February 09, 2016 1103    Functional Assessment Tool Used Clinical judgement based on assesssment of strength, mobility and functional testing.    Functional Limitation Mobility: Walking and moving around   Mobility: Walking and Moving Around Current Status [phone]) At least 20 percent but less than 40 percent impaired, limited or restricted   Mobility: Walking and Moving Around Goal Status [phone]) At least 1 percent but less than 20 percent impaired, limited or restricted       Problem List Patient Active  Problem List   Diagnosis Date Noted  . PVD (peripheral vascular disease) (HCC) 11/05/2013    11:09 AM,02-09-2016 Marylyn Ishihara PT, DPT [facility] [phone]  [facility] [address] [place], [place], [address] Phone: [phone]   Fax:  [phone]  Name: LIBORIA PUTNAM MRN: [id number] Date of Birth: 11/28/1931

## 2016-01-26 ENCOUNTER — Ambulatory Visit (HOSPITAL_COMMUNITY): Payer: Medicare Other | Admitting: Physical Therapy

## 2016-01-26 DIAGNOSIS — R2689 Other abnormalities of gait and mobility: Secondary | ICD-10-CM

## 2016-01-26 DIAGNOSIS — M6281 Muscle weakness (generalized): Secondary | ICD-10-CM

## 2016-01-26 DIAGNOSIS — R29898 Other symptoms and signs involving the musculoskeletal system: Secondary | ICD-10-CM

## 2016-01-26 NOTE — Therapy (Signed)
[facility] [address] [place], [place], [address] Phone: [phone]   Fax:  [phone]  Physical Therapy Treatment  Patient Details  Name: Theresa PigeonFlorene H Shouse MRN: [id number] Date of Birth: 1931-08-18 Referring Provider: Nita SellsJohn Hall, MD  Encounter Date: 01/26/2016      PT End of Session - 01/26/16 1426    Visit Number 2   Number of Visits 13   Date for PT Re-Evaluation 02/12/16   Authorization Type UHC Medicare   Authorization Time Period 01/22/16 to 03/04/16   PT Start Time 1352   PT Stop Time 1430   PT Time Calculation (min) 38 min   Activity Tolerance No increased pain;Patient tolerated treatment well   Behavior During Therapy [facility] for tasks assessed/performed      Past Medical History:  Diagnosis Date  . Arthritis   . Complication of anesthesia    was told she had a small airway  . Glaucoma   . Hyperlipemia   . Hypertension   . TIA (transient ischemic attack)    had a finger go limp-was told tia.    Past Surgical History:  Procedure Laterality Date  . CESAREAN SECTION    . EYE SURGERY  2009   rt eye  . TONSILLECTOMY    . TRIGGER FINGER RELEASE  09/27/2011   Procedure: RELEASE TRIGGER FINGER/A-1 PULLEY;  Surgeon: Nicki ReaperGary R Kuzma, MD;  Location: [facility];  Service: Orthopedics;  Laterality: Left;  Left middle finger    There were no vitals filed for this visit.      Subjective Assessment - 01/26/16 1401    Subjective Pt states she has not been keeping up with her exercises as she should due to taking care of her husband.  Reports some lumbar discomfort with early morning seated position but not alot of pain, just shooting pain at times.   Currently in Pain? No/denies                                 PT Education - 01/26/16 1423    Education provided Yes   Education Details eval goals and findings, review of HEP.  Encouragement to attempt a few exercises before getting out of  bed and when first laying down at night.   Person(s) Educated Patient   Methods Explanation;Demonstration;Tactile cues;Verbal cues;Handout   Comprehension Verbalized understanding;Returned demonstration;Verbal cues required;Tactile cues required          PT Short Term Goals - 01/22/16 1056      PT SHORT TERM GOAL #1   Title Pt will demo consistency and independence with her HEP, to improve BLE strength and flexibility.   Time 2   Period Weeks   Status New     PT SHORT TERM GOAL #2   Title Pt will demo improved BLE strength to atleast 4/5 MMT, to increase safety with stair negotiation at home.    Time 3   Period Weeks   Status New     PT SHORT TERM GOAL #3   Title pt will demo improved single leg balance evident by her ability to maintain SLS on each LE for atleast 10 sec, 2/3 trials.    Time 3   Period Weeks   Status New           PT Long Term Goals - 01/22/16 1059      PT LONG TERM GOAL #1  Title Pt will demo improved BLE strength to atleast 4+/5 MMT to improve safety with functional activity at home and in the community.   Time 6   Period Weeks   Status New     PT LONG TERM GOAL #2   Title Pt will demo improved hamstring flexibility to lacking no more than 25 deg at 90/90 testing, to improve her sitting/standing posture.   Time 6   Period Weeks   Status New     PT LONG TERM GOAL #3   Title Pt will demo improved balance and strength evident by her ability to ascend/descend 4, 6" steps while holding no more than 5# and without noted LOB/unsteadiness, 2/3 trials.    Time 6   Period Weeks   Status New               Plan - 01/26/16 1429    Clinical Impression Statement reviewed evaluation findings/goals and HEP with patient.  Pt unable to recall HEP and seemed somewhat confused about the theraband and how to use.  Pt required verbal and tactile cues for form and control with therex.  Added hip strengthening exercises this session, however did not add to  HEP. Pt will need review of HEP every visit until independent.   Rehab Potential Good   PT Frequency 2x / week   PT Duration 6 weeks  decrease to 1x/week as deemed appropriate by evaluating therapist   PT Treatment/Interventions ADLs/Self Care Home Management;Moist Heat;Gait training;Stair training;Functional mobility training;Therapeutic activities;Patient/family education;Neuromuscular re-education;Balance training;Manual techniques;Dry needling;Passive range of motion   PT Next Visit Plan hip strengthening: hip extensors/abductors/hamstrings; hamstring stretch, attempt lateral step downs with 4" box to see if quad control improves   PT Home Exercise Plan supine bridge with red TB around knees 2x15 reps, sidelying clamshells 2x10 reps with red TB   Consulted and Agree with Plan of Care Patient      Patient will benefit from skilled therapeutic intervention in order to improve the following deficits and impairments:  Decreased activity tolerance, Decreased balance, Decreased endurance, Decreased strength, Impaired flexibility, Improper body mechanics, Pain, Postural dysfunction, Decreased mobility  Visit Diagnosis: Muscle weakness (generalized)  Other abnormalities of gait and mobility  Other symptoms and signs involving the musculoskeletal system     Problem List Patient Active Problem List   Diagnosis Date Noted  . PVD (peripheral vascular disease) (HCC) 11/05/2013    Lurena Nida, PTA/CLT [phone]  01/26/2016, 2:34 PM  [facility] [address] [place], [place], [address] Phone: [phone]   Fax:  [phone]  Name: Theresa Morris MRN: [id number] Date of Birth: [birth year]

## 2016-01-28 ENCOUNTER — Ambulatory Visit (HOSPITAL_COMMUNITY): Payer: Medicare Other | Admitting: Physical Therapy

## 2016-01-28 DIAGNOSIS — M6281 Muscle weakness (generalized): Secondary | ICD-10-CM | POA: Diagnosis not present

## 2016-01-28 DIAGNOSIS — R29898 Other symptoms and signs involving the musculoskeletal system: Secondary | ICD-10-CM

## 2016-01-28 DIAGNOSIS — R2689 Other abnormalities of gait and mobility: Secondary | ICD-10-CM

## 2016-01-28 NOTE — Therapy (Signed)
[facility] [address] [place], [place], [address] Phone: [phone]   Fax:  [phone]  Physical Therapy Treatment  Patient Details  Name: Theresa Morris MRN: [id number] Date of Birth: [birth year] Referring Provider: Nita Sells, MD  Encounter Date: 01/28/2016      PT End of Session - 01/28/16 1113    Visit Number 3   Number of Visits 13   Date for PT Re-Evaluation 02/12/16   Authorization Type UHC Medicare   Authorization Time Period 01/22/16 to 03/04/16   PT Start Time 1032   PT Stop Time 1113   PT Time Calculation (min) 41 min   Activity Tolerance No increased pain;Patient tolerated treatment well   Behavior During Therapy [facility] for tasks assessed/performed      Past Medical History:  Diagnosis Date  . Arthritis   . Complication of anesthesia    was told she had a small airway  . Glaucoma   . Hyperlipemia   . Hypertension   . TIA (transient ischemic attack)    had a finger go limp-was told tia.    Past Surgical History:  Procedure Laterality Date  . CESAREAN SECTION    . EYE SURGERY  2009   rt eye  . TONSILLECTOMY    . TRIGGER FINGER RELEASE  09/27/2011   Procedure: RELEASE TRIGGER FINGER/A-1 PULLEY;  Surgeon: Nicki Reaper, MD;  Location: [facility];  Service: Orthopedics;  Laterality: Left;  Left middle finger    There were no vitals filed for this visit.      Subjective Assessment - 01/28/16 1036    Subjective Pt states things are going well. She has increased soreness following her sessions. She has some questions about her HEP and would like to review it.    Currently in Pain? No/denies                         [facility] Adult PT Treatment/Exercise - 01/28/16 0001      Knee/Hip Exercises: Standing   Forward Step Up 1 set;Both;15 reps;Hand Hold: 1;Step Height: 4"     Knee/Hip Exercises: Supine   Bridges Both;1 set;5 reps   Bridges Limitations to ensure proper technique     Other Supine Knee/Hip Exercises straight leg bridge 2x10 reps, 8" box    Other Supine Knee/Hip Exercises ab set with bent knee flexion/extension x10 reps each      Knee/Hip Exercises: Sidelying   Clams 2x10 reps with red TB     Knee/Hip Exercises: Prone   Hamstring Curl 2 sets;10 reps   Hamstring Curl Limitations red TB                PT Education - 01/28/16 1041    Education provided Yes   Education Details reviewed technique with HEP   Person(s) Educated Patient   Methods Explanation;Verbal cues   Comprehension Verbalized understanding;Returned demonstration          PT Short Term Goals - 01/22/16 1056      PT SHORT TERM GOAL #1   Title Pt will demo consistency and independence with her HEP, to improve BLE strength and flexibility.   Time 2   Period Weeks   Status New     PT SHORT TERM GOAL #2   Title Pt will demo improved BLE strength to atleast 4/5 MMT, to increase safety with stair negotiation at home.    Time 3   Period Weeks  Status New     PT SHORT TERM GOAL #3   Title pt will demo improved single leg balance evident by her ability to maintain SLS on each LE for atleast 10 sec, 2/3 trials.    Time 3   Period Weeks   Status New           PT Long Term Goals - 01/22/16 1059      PT LONG TERM GOAL #1   Title Pt will demo improved BLE strength to atleast 4+/5 MMT to improve safety with functional activity at home and in the community.   Time 6   Period Weeks   Status New     PT LONG TERM GOAL #2   Title Pt will demo improved hamstring flexibility to lacking no more than 25 deg at 90/90 testing, to improve her sitting/standing posture.   Time 6   Period Weeks   Status New     PT LONG TERM GOAL #3   Title Pt will demo improved balance and strength evident by her ability to ascend/descend 4, 6" steps while holding no more than 5# and without noted LOB/unsteadiness, 2/3 trials.    Time 6   Period Weeks   Status New                Plan - 01/28/16 1113    Clinical Impression Statement Today's session began with review of HEP to answer all questions the pt had concerning set up. She was able to demonstrate proper technique with minimal corrections made. Followed this with therex to improve LE and trunk strength to address pt concerns with functional activity. Ended session without increase in pain, pt reporting fatigue only. Will continue with current POC.    Rehab Potential Good   PT Frequency 2x / week   PT Duration 6 weeks  decrease to 1x/week as deemed appropriate by evaluating therapist   PT Treatment/Interventions ADLs/Self Care Home Management;Moist Heat;Gait training;Stair training;Functional mobility training;Therapeutic activities;Patient/family education;Neuromuscular re-education;Balance training;Manual techniques;Dry needling;Passive range of motion   PT Next Visit Plan hip extensors/abductors/hamstring strengthening; trunk stabilization, attempt lateral step downs with 4" box to address quad eccentric strength   PT Home Exercise Plan supine bridge with red TB around knees 2x15 reps, sidelying clamshells 2x10 reps with red TB   Consulted and Agree with Plan of Care Patient      Patient will benefit from skilled therapeutic intervention in order to improve the following deficits and impairments:  Decreased activity tolerance, Decreased balance, Decreased endurance, Decreased strength, Impaired flexibility, Improper body mechanics, Pain, Postural dysfunction, Decreased mobility  Visit Diagnosis: Muscle weakness (generalized)  Other abnormalities of gait and mobility  Other symptoms and signs involving the musculoskeletal system     Problem List Patient Active Problem List   Diagnosis Date Noted  . PVD (peripheral vascular disease) (HCC) 11/05/2013    12:41 PM,01/28/16 Marylyn IshiharaSara Kiser PT, DPT [facility] [phone]   [facility] [address] [place], [place], [address] Phone: [phone]   Fax:  [phone]  Name: Sheilah PigeonFlorene H Fallert MRN: [id number] Date of Birth: 1931-08-21

## 2016-02-02 ENCOUNTER — Ambulatory Visit (HOSPITAL_COMMUNITY): Payer: Medicare Other | Admitting: Physical Therapy

## 2016-02-02 DIAGNOSIS — M6281 Muscle weakness (generalized): Secondary | ICD-10-CM | POA: Diagnosis not present

## 2016-02-02 DIAGNOSIS — R2689 Other abnormalities of gait and mobility: Secondary | ICD-10-CM

## 2016-02-02 DIAGNOSIS — R29898 Other symptoms and signs involving the musculoskeletal system: Secondary | ICD-10-CM

## 2016-02-02 NOTE — Therapy (Signed)
[facility] [address] [place], [place], [address] Phone: [phone]   Fax:  [phone]  Physical Therapy Treatment  Patient Details  Name: Theresa Morris MRN: [id number] Date of Birth: August 29, 1931 Referring Provider: Nita Sells, MD  Encounter Date: 02/02/2016      PT End of Session - 02/02/16 1110    Visit Number 4   Number of Visits 13   Date for PT Re-Evaluation 02/12/16   Authorization Type UHC Medicare   Authorization Time Period 01/22/16 to 03/04/16   PT Start Time 1034   PT Stop Time 1112   PT Time Calculation (min) 38 min   Activity Tolerance No increased pain;Patient tolerated treatment well   Behavior During Therapy [facility] for tasks assessed/performed      Past Medical History:  Diagnosis Date  . Arthritis   . Complication of anesthesia    was told she had a small airway  . Glaucoma   . Hyperlipemia   . Hypertension   . TIA (transient ischemic attack)    had a finger go limp-was told tia.    Past Surgical History:  Procedure Laterality Date  . CESAREAN SECTION    . EYE SURGERY  2009   rt eye  . TONSILLECTOMY    . TRIGGER FINGER RELEASE  09/27/2011   Procedure: RELEASE TRIGGER FINGER/A-1 PULLEY;  Surgeon: Nicki Reaper, MD;  Location: [facility];  Service: Orthopedics;  Laterality: Left;  Left middle finger    There were no vitals filed for this visit.      Subjective Assessment - 02/02/16 1036    Subjective Pt reports that she had to help with her husband alot more than normal. She is sore in her legs but is not able to rate or describe it.    Currently in Pain? Yes  B glute region, thighs, etc.   Pain Descriptors / Indicators Aching                         [facility] Adult PT Treatment/Exercise - 02/02/16 0001      Knee/Hip Exercises: Stretches   Hip Flexor Stretch Both;4 reps;20 seconds   Hip Flexor Stretch Limitations 12" step      Knee/Hip Exercises: Standing   Knee  Flexion Both;2 sets;10 reps   Knee Flexion Limitations 2# ankle weight    Step Down Both;Hand Hold: 2;Step Height: 4";Step Height: 2";20 reps;2 set  1x10 on each with 4" step, 2 hand hold    Step Down Limitations lateral step down, started initially with 4" but unable to perform correctly, decreased to 2"                 PT Education - 02/02/16 1221    Education provided Yes   Education Details addition to HEP; importance of proper technique during therex to carry over with functional activity.    Person(s) Educated Patient   Methods Explanation;Demonstration;Verbal cues   Comprehension Verbalized understanding;Returned demonstration          PT Short Term Goals - 01/22/16 1056      PT SHORT TERM GOAL #1   Title Pt will demo consistency and independence with her HEP, to improve BLE strength and flexibility.   Time 2   Period Weeks   Status New     PT SHORT TERM GOAL #2   Title Pt will demo improved BLE strength to atleast 4/5 MMT, to increase safety with  stair negotiation at home.    Time 3   Period Weeks   Status New     PT SHORT TERM GOAL #3   Title pt will demo improved single leg balance evident by her ability to maintain SLS on each LE for atleast 10 sec, 2/3 trials.    Time 3   Period Weeks   Status New           PT Long Term Goals - 01/22/16 1059      PT LONG TERM GOAL #1   Title Pt will demo improved BLE strength to atleast 4+/5 MMT to improve safety with functional activity at home and in the community.   Time 6   Period Weeks   Status New     PT LONG TERM GOAL #2   Title Pt will demo improved hamstring flexibility to lacking no more than 25 deg at 90/90 testing, to improve her sitting/standing posture.   Time 6   Period Weeks   Status New     PT LONG TERM GOAL #3   Title Pt will demo improved balance and strength evident by her ability to ascend/descend 4, 6" steps while holding no more than 5# and without noted LOB/unsteadiness, 2/3  trials.    Time 6   Period Weeks   Status New               Plan - 02/02/16 1111    Clinical Impression Statement Pt arrived today with increased soreness after helping her husband all weekend with things around the house. Focused primarily on functional strengthening to improve safety with activity such as stair negotiation. Pt needing increased verbal/visual cues for step down activity secondary to impaired coordination and eccentric quadriceps weakness. This did improve some by the end of the activity, with her able to perform on a 4" step. Ended session with discussion of HEP with 1 addition and pt verbalizing understanding.    Rehab Potential Good   PT Frequency 2x / week   PT Duration 6 weeks  decrease to 1x/week as deemed appropriate by evaluating therapist   PT Treatment/Interventions ADLs/Self Care Home Management;Moist Heat;Gait training;Stair training;Functional mobility training;Therapeutic activities;Patient/family education;Neuromuscular re-education;Balance training;Manual techniques;Dry needling;Passive range of motion   PT Next Visit Plan hip extensors/abductors/hamstring strengthening; trunk stabilization, attempt lateral step downs with 4" box to address quad eccentric strength   PT Home Exercise Plan supine bridge with red TB around knees 2x15 reps, sidelying clamshells 2x10 reps with red TB   Consulted and Agree with Plan of Care Patient      Patient will benefit from skilled therapeutic intervention in order to improve the following deficits and impairments:  Decreased activity tolerance, Decreased balance, Decreased endurance, Decreased strength, Impaired flexibility, Improper body mechanics, Pain, Postural dysfunction, Decreased mobility  Visit Diagnosis: Muscle weakness (generalized)  Other abnormalities of gait and mobility  Other symptoms and signs involving the musculoskeletal system     Problem List Patient Active Problem List   Diagnosis Date  Noted  . PVD (peripheral vascular disease) (HCC) 11/05/2013     12:23 PM,02/02/16 Marylyn IshiharaSara Kiser PT, DPT [facility] [phone]   [facility] [address] [place], [place], [address] Phone: [phone]   Fax:  [phone]  Name: Theresa Morris MRN: [id number] Date of Birth: 06/16/1931

## 2016-02-04 ENCOUNTER — Ambulatory Visit (HOSPITAL_COMMUNITY): Payer: Medicare Other | Attending: Internal Medicine | Admitting: Physical Therapy

## 2016-02-04 DIAGNOSIS — R29898 Other symptoms and signs involving the musculoskeletal system: Secondary | ICD-10-CM

## 2016-02-04 DIAGNOSIS — R2689 Other abnormalities of gait and mobility: Secondary | ICD-10-CM | POA: Insufficient documentation

## 2016-02-04 DIAGNOSIS — M6281 Muscle weakness (generalized): Secondary | ICD-10-CM

## 2016-02-04 NOTE — Therapy (Signed)
[facility] [address] [place], [place], [address] Phone: [phone]   Fax:  [phone]  Physical Therapy Treatment  Patient Details  Name: Sheilah PigeonFlorene H Milholland MRN: [id number] Date of Birth: 1931-06-15 Referring Provider: Nita SellsJohn Hall, MD  Encounter Date: 02/04/2016      PT End of Session - 02/04/16 1110    Visit Number 5   Number of Visits 13   Date for PT Re-Evaluation 02/12/16   Authorization Type UHC Medicare   Authorization Time Period 01/22/16 to 03/04/16   PT Start Time 0950   PT Stop Time 1030   PT Time Calculation (min) 40 min   Activity Tolerance No increased pain;Patient tolerated treatment well   [facility] for tasks assessed/performed      Past Medical History:  Diagnosis Date  . Arthritis   . Complication of anesthesia    was told she had a small airway  . Glaucoma   . Hyperlipemia   . Hypertension   . TIA (transient ischemic attack)    had a finger go limp-was told tia.    Past Surgical History:  Procedure Laterality Date  . CESAREAN SECTION    . EYE SURGERY  2009   rt eye  . TONSILLECTOMY    . TRIGGER FINGER RELEASE  09/27/2011   Procedure: RELEASE TRIGGER FINGER/A-1 PULLEY;  Surgeon: Nicki ReaperGary R Kuzma, MD;  Location: [facility];  Service: Orthopedics;  Laterality: Left;  Left middle finger    There were no vitals filed for this visit.      Subjective Assessment - 02/04/16 0950    Subjective Pt reports that things are going well. She was very sore following her last session but this has improved and she is not having any pain currently.    Currently in Pain? No/denies                         [facility]/Exercise - 02/04/16 0001      Knee/Hip Exercises: Standing   Other Standing Knee Exercises Standing hip hike isometrics x15 reps against wall      Knee/Hip Exercises: Seated   Other Seated Knee/Hip Exercises ant/post pelvic tilts x20 reps,  Lt/Rt plevic tilts x20 reps      Knee/Hip Exercises: Sidelying   Hip ABduction Both;2 sets;10 reps   Hip ABduction Limitations against wall for icreased glute activation     Manual Therapy   Manual Therapy Passive ROM;Joint mobilization   Manual therapy comments performed separately from all other interventions.    Joint Mobilization Grade 2 PA sacral mobilizations 5x30 sec    Passive ROM hamstring/piriformis/quad stretch, BLE 4x20 sec each                 PT Education - 02/04/16 1110    Education provided Yes   Education Details implications of exercises performed, addition to HEP   Person(s) Educated Patient   Methods Explanation;Verbal cues;Tactile cues;Handout   Comprehension Verbalized understanding;Returned demonstration          PT Short Term Goals - 01/22/16 1056      PT SHORT TERM GOAL #1   Title Pt will demo consistency and independence with her HEP, to improve BLE strength and flexibility.   Time 2   Period Weeks   Status New     PT SHORT TERM GOAL #2   Title Pt will demo improved BLE strength to atleast 4/5 MMT,  to increase safety with stair negotiation at home.    Time 3   Period Weeks   Status New     PT SHORT TERM GOAL #3   Title pt will demo improved single leg balance evident by her ability to maintain SLS on each LE for atleast 10 sec, 2/3 trials.    Time 3   Period Weeks   Status New           PT Long Term Goals - 01/22/16 1059      PT LONG TERM GOAL #1   Title Pt will demo improved BLE strength to atleast 4+/5 MMT to improve safety with functional activity at home and in the community.   Time 6   Period Weeks   Status New     PT LONG TERM GOAL #2   Title Pt will demo improved hamstring flexibility to lacking no more than 25 deg at 90/90 testing, to improve her sitting/standing posture.   Time 6   Period Weeks   Status New     PT LONG TERM GOAL #3   Title Pt will demo improved balance and strength evident by her ability to  ascend/descend 4, 6" steps while holding no more than 5# and without noted LOB/unsteadiness, 2/3 trials.    Time 6   Period Weeks   Status New               Plan - 02/04/16 1112    Clinical Impression Statement Today's session began with manual techniques to improve flexibility and pain in sacral region. This was followed with exercises to further improve hip strength, with pt able to perform without increase in symptoms. She does require increased cues to perform her exercises correctly. Added an exercise to pt's HEP without any concerns over technique. Will continue with current POC.   Rehab Potential Good   PT Frequency 2x / week   PT Duration 6 weeks  decrease to 1x/week as deemed appropriate by evaluating therapist   PT Treatment/Interventions ADLs/Self Care Home Management;Moist Heat;Gait training;Stair training;Functional mobility training;Therapeutic activities;Patient/family education;Neuromuscular re-education;Balance training;Manual techniques;Dry needling;Passive range of motion   PT Next Visit Plan seated trunk strengthening, supine dead bug, step down with 4" step   PT Home Exercise Plan supine bridge with red TB around knees 2x15 reps, sidelying clamshells 2x10 reps with red TB   Consulted and Agree with Plan of Care Patient      Patient will benefit from skilled therapeutic intervention in order to improve the following deficits and impairments:  Decreased activity tolerance, Decreased balance, Decreased endurance, Decreased strength, Impaired flexibility, Improper body mechanics, Pain, Postural dysfunction, Decreased mobility  Visit Diagnosis: Muscle weakness (generalized)  Other abnormalities of gait and mobility  Other symptoms and signs involving the musculoskeletal system     Problem List Patient Active Problem List   Diagnosis Date Noted  . PVD (peripheral vascular disease) (HCC) 11/05/2013   12:33 PM,02/04/16 Marylyn Ishihara PT, DPT [facility] [phone]  [facility] [address] [place], [place], [address] Phone: [phone]   Fax:  [phone]  Name: OMAIRA MELLEN MRN: [id number] Date of Birth: 1931/07/03

## 2016-02-09 ENCOUNTER — Ambulatory Visit (HOSPITAL_COMMUNITY): Payer: Medicare Other | Admitting: Physical Therapy

## 2016-02-09 DIAGNOSIS — M6281 Muscle weakness (generalized): Secondary | ICD-10-CM

## 2016-02-09 DIAGNOSIS — R29898 Other symptoms and signs involving the musculoskeletal system: Secondary | ICD-10-CM

## 2016-02-09 DIAGNOSIS — R2689 Other abnormalities of gait and mobility: Secondary | ICD-10-CM

## 2016-02-09 NOTE — Therapy (Signed)
[place], [place], [address] Phone: [phone]   Fax:  [phone]  Physical Therapy Treatment/Reassessment   Patient Details  Name: ROMINA DIVIRGILIO MRN: [id number] Date of Birth: [birth year] Referring Provider: Allyn Kenner, MD  Encounter Date: 02/09/2016      PT End of Session - 02/09/16 1401    Visit Number 6   Number of Visits 13   Date for PT Re-Evaluation 03/04/16   Authorization Type UHC Medicare *[employer] done 6th visit    Authorization Time Period 01/22/16 to 03/04/16   PT Start Time 1033   PT Stop Time 1115   PT Time Calculation (min) 42 min   Activity Tolerance No increased pain;Patient tolerated treatment well   Behavior During Therapy [facility] for tasks assessed/performed      Past Medical History:  Diagnosis Date  . Arthritis   . Complication of anesthesia    was told she had a small airway  . Glaucoma   . Hyperlipemia   . Hypertension   . TIA (transient ischemic attack)    had a finger go limp-was told tia.    Past Surgical History:  Procedure Laterality Date  . CESAREAN SECTION    . EYE SURGERY  2009   rt eye  . TONSILLECTOMY    . TRIGGER FINGER RELEASE  09/27/2011   Procedure: RELEASE TRIGGER FINGER/A-1 PULLEY;  Surgeon: Wynonia Sours, MD;  Location: [place];  Service: Orthopedics;  Laterality: Left;  Left middle finger    There were no vitals filed for this visit.      Subjective Assessment - 02/09/16 1038    Subjective Pt reports that she continues to perform her HEP and feels sore. She is not currently having any soreness but did note some soreness this morning.    Currently in Pain? No/denies            [facility] PT Assessment - 02/09/16 0001      Assessment   Medical Diagnosis Multiple falls    Referring Provider Allyn Kenner, MD   Onset Date/Surgical Date --  several months ago.   Next MD Visit none as of now    Prior Therapy None, saw chiropractor     Precautions   Precautions None     Home Environment   Additional Comments stairs to reach bedroom, 5 steps then landing, followed by 7-8 steps      Prior Function   Level of Independence Independent   Leisure helps care for her husband      Cognition   Overall Cognitive Status Within Functional Limits for tasks assessed     Sensation   Light Touch Appears Intact     Functional Tests   Functional tests Single leg stance     Single Leg Stance   Comments SLS up to 5 sec on each LE, x2 trials.      AROM   Lumbar Extension x10 reps in standing      Strength   Right Hip Flexion 4+/5   Right Hip Extension 4/5   Right Hip ABduction 4-/5   Left Hip Flexion 4+/5   Left Hip Extension 4/5   Left Hip ABduction 4-/5   Right Knee Extension 5/5   Left Knee Extension 5/5   Right Ankle Dorsiflexion 5/5   Left Ankle Dorsiflexion 5/5     Flexibility   Soft Tissue Assessment /Muscle Length yes   Hamstrings Rt: 27 deg, Lt: 24 deg  lacking    Quadriceps WNL   Piriformis WNL     Palpation    Tenderness with palpation along B lumbar paraspinals                           Prone Knee Bend Test   Findings Negative              Transfers   Five time sit to stand comments  9.5 sec, no UE      Ambulation/Gait   Gait Comments Ascend/descend 4, 6" steps with 1 handrail x2 RT. Poor eccentric control on each LE.                                        [facility] Adult PT Treatment/Exercise - 02/09/16 0001      Knee/Hip Exercises: Standing   Step Down Both;1 set;Hand Hold: 2;S tep Height: 4";15 reps  2nd set with 6" step x10 reps    Step Down Limitations lateral step down.     Knee/Hip Exercises: Seated   Other Seated Knee/Hip Exercises ant/post pelvic tilts x20 reps;  seated trunk rotation stretch 5x10 sec each                PT Education - 02/09/16 1400    Education provided Yes   Education Details progress towards goals and areas that will need to  continue to be addressed; technique with therex    Person(s) Educated Patient   Methods Explanation;Verbal cues;Tactile cues   Comprehension Verbalized understanding;Returned demonstration          PT Short Term Goals - 02/09/16 1049      PT SHORT TERM GOAL #1   Title Pt will demo consistency and independence with her HEP, to improve BLE strength and flexibility.   Time 2   Period Weeks   Status On-going     PT SHORT TERM GOAL #2   Title Pt will demo improved BLE strength to atleast 4/5 MMT, to increase safety with stair negotiation at home.    Time 3   Period Weeks   Status Achieved     PT SHORT TERM GOAL #3   Title pt will demo improved single leg balance evident by her ability to maintain SLS on each LE for atleast 10 sec, 2/3 trials.    Baseline Rt: 6-7 sec  Lt: 7-9 sec    Time 3   Period Weeks   Status Partially Met           PT Long Term Goals - 02/09/16 1050      PT LONG TERM GOAL #1   Title Pt will demo improved BLE strength to atleast 4+/5 MMT to improve safety with functional activity at home and in the community.   Time 6   Period Weeks   Status New     PT LONG TERM GOAL #2   Title Pt will demo improved hamstring flexibility to lacking no more than 25 deg at 90/90 testing, to improve her sitting/standing posture.   Time 6   Period Weeks   Status Achieved     PT LONG TERM GOAL #3   Title Pt will demo improved balance and strength evident by her ability to ascend/descend 4, 6" steps while holding no more than 5# and without noted LOB/unsteadiness, 2/3 trials.    Time 6  Period Weeks   Status Unable to assess               Plan - February 11, 2016 1406    Clinical Impression Statement Pt was reassessed today having made progress towards a majority of her goals with improved BLE strength, balance and functional performance. She continues to perform her HEP consistently at home and feels that her strength is improving. She does continued to display hip  weakness during MMT and functional activity such as stair negotiation and will continue to benefit from focus on this to improve her safety with activity at home. Discussed reassessment findings with the pt and she verbalized understanding at this time.     Rehab Potential Good   PT Frequency 2x / week   PT Duration 6 weeks  decrease to 1x/week as deemed appropriate by evaluating therapist   PT Treatment/Interventions ADLs/Self Care Home Management;Moist Heat;Gait training;Stair training;Functional mobility training;Therapeutic activities;Patient/family education;Neuromuscular re-education;Balance training;Manual techniques;Dry needling;Passive range of motion   PT Next Visit Plan step down with 6" step, trunk strengthening in sitting   PT Home Exercise Plan supine bridge with red TB around knees 2x15 reps, sidelying clamshells 2x10 reps with red TB   Consulted and Agree with Plan of Care Patient      Patient will benefit from skilled therapeutic intervention in order to improve the following deficits and impairments:  Decreased activity tolerance, Decreased balance, Decreased endurance, Decreased strength, Impaired flexibility, Improper body mechanics, Pain, Postural dysfunction, Decreased mobility  Visit Diagnosis: Muscle weakness (generalized)  Other abnormalities of gait and mobility  Other symptoms and signs involving the musculoskeletal system       G-Codes - February 11, 2016 1411    Functional Assessment Tool Used Clinical judgement based on assesssment of strength, mobility and functional testing.    Functional Limitation Mobility: Walking and moving around   Mobility: Walking and Moving Around Current Status [phone]) At least 20 percent but less than 40 percent impaired, limited or restricted   Mobility: Walking and Moving Around Goal Status [phone]) At least 1 percent but less than 20 percent impaired, limited or restricted      Problem List Patient Active Problem List   Diagnosis  Date Noted  . PVD (peripheral vascular disease) ([place]) 11/05/2013   2:13 PM,02/11/16 Elly Modena PT, DPT [facility] [place] [address] [place], [place], [address] Phone: [phone]   Fax:  [phone]  Name: LAURIELLE SELMON MRN: [id number] Date of Birth: Jan 20, 1931

## 2016-02-11 ENCOUNTER — Ambulatory Visit (HOSPITAL_COMMUNITY): Payer: Medicare Other | Admitting: Physical Therapy

## 2016-02-11 DIAGNOSIS — M6281 Muscle weakness (generalized): Secondary | ICD-10-CM

## 2016-02-11 DIAGNOSIS — R29898 Other symptoms and signs involving the musculoskeletal system: Secondary | ICD-10-CM

## 2016-02-11 DIAGNOSIS — R2689 Other abnormalities of gait and mobility: Secondary | ICD-10-CM

## 2016-02-11 NOTE — Therapy (Signed)
[place], [place], [address] Phone: [phone]   Fax:  [phone]  Physical Therapy Treatment  Patient Details  Name: Theresa Morris MRN: [id number] Date of Birth: October 10, 1931 Referring Provider: Allyn Kenner, MD  Encounter Date: 02/11/2016      PT End of Session - 02/11/16 1108    Visit Number 7   Number of Visits 13   Date for PT Re-Evaluation 03/04/16   Authorization Type [employer] Medicare *Gcodes done 6th visit    Authorization Time Period 01/22/16 to 03/04/16   PT Start Time 1038  pt arrived late    PT Stop Time 1115   PT Time Calculation (min) 37 min   Activity Tolerance No increased pain;Patient tolerated treatment well   Behavior During Therapy [facility] for tasks assessed/performed      Past Medical History:  Diagnosis Date  . Arthritis   . Complication of anesthesia    was told she had a small airway  . Glaucoma   . Hyperlipemia   . Hypertension   . TIA (transient ischemic attack)    had a finger go limp-was told tia.    Past Surgical History:  Procedure Laterality Date  . CESAREAN SECTION    . EYE SURGERY  2009   rt eye  . TONSILLECTOMY    . TRIGGER FINGER RELEASE  09/27/2011   Procedure: RELEASE TRIGGER FINGER/A-1 PULLEY;  Surgeon: Wynonia Sours, MD;  Location: [place];  Service: Orthopedics;  Laterality: Left;  Left middle finger    There were no vitals filed for this visit.      Subjective Assessment - 02/11/16 1040    Subjective Pt reports that she has been a little sore since her last session but continues to perform her HEP without difficulty.   Pertinent History TIA, arthritis, glaucoma, HTN, HLD   Limitations Other (comment)   Currently in Pain? No/denies                         [facility] Adult PT Treatment/Exercise - 02/11/16 0001      Knee/Hip Exercises: Standing   Forward Step Up Both;1 set;15 reps;Hand Hold: 1;Step Height: 6"   Forward Step Up  Limitations opposite knee drive    Other Standing Knee Exercises UE/LU march against wall x15 reps    Other Standing Knee Exercises UE pressdown during exhale x15 reps      Knee/Hip Exercises: Seated   Other Seated Knee/Hip Exercises seated trunk rotation Lt/Rt with green TB 2x15 reps                 PT Education - 02/11/16 1206    Education provided Yes   Education Details Encouraged continued HEP adherence at home. Possible techniques to try next session to address possible Lt ITB pain.   Person(s) Educated Patient   Methods Explanation   Comprehension Verbalized understanding          PT Short Term Goals - 02/09/16 1049      PT SHORT TERM GOAL #1   Title Pt will demo consistency and independence with her HEP, to improve BLE strength and flexibility.   Time 2   Period Weeks   Status On-going     PT SHORT TERM GOAL #2   Title Pt will demo improved BLE strength to atleast 4/5 MMT, to increase safety with stair negotiation at home.    Time 3   Period  Weeks   Status Achieved     PT SHORT TERM GOAL #3   Title pt will demo improved single leg balance evident by her ability to maintain SLS on each LE for atleast 10 sec, 2/3 trials.    Baseline Rt: 6-7 sec  Lt: 7-9 sec    Time 3   Period Weeks   Status Partially Met           PT Long Term Goals - 02/09/16 1050      PT LONG TERM GOAL #1   Title Pt will demo improved BLE strength to atleast 4+/5 MMT to improve safety with functional activity at home and in the community.   Time 6   Period Weeks   Status New     PT LONG TERM GOAL #2   Title Pt will demo improved hamstring flexibility to lacking no more than 25 deg at 90/90 testing, to improve her sitting/standing posture.   Time 6   Period Weeks   Status Achieved     PT LONG TERM GOAL #3   Title Pt will demo improved balance and strength evident by her ability to ascend/descend 4, 6" steps while holding no more than 5# and without noted LOB/unsteadiness,  2/3 trials.    Time 6   Period Weeks   Status Unable to assess               Plan - 02/11/16 1210    Clinical Impression Statement Continued this session with exercise to further improve glute max and glute med strength/activation during functional activity. Pt was able to perform all exercises without any increase in symptoms, except for step up activity that increased pain over the greater trochanter. This did resolve after several repetitions however. Will continue with current POC.    Rehab Potential Good   PT Frequency 2x / week   PT Duration 6 weeks  decrease to 1x/week as deemed appropriate by evaluating therapist   PT Treatment/Interventions ADLs/Self Care Home Management;Moist Heat;Gait training;Stair training;Functional mobility training;Therapeutic activities;Patient/family education;Neuromuscular re-education;Balance training;Manual techniques;Dry needling;Passive range of motion   PT Next Visit Plan step up with knee drive, STM Lt ITB   PT Home Exercise Plan supine bridge with red TB around knees 2x15 reps, sidelying clamshells 2x10 reps with red TB   Consulted and Agree with Plan of Care Patient      Patient will benefit from skilled therapeutic intervention in order to improve the following deficits and impairments:  Decreased activity tolerance, Decreased balance, Decreased endurance, Decreased strength, Impaired flexibility, Improper body mechanics, Pain, Postural dysfunction, Decreased mobility  Visit Diagnosis: Muscle weakness (generalized)  Other abnormalities of gait and mobility  Other symptoms and signs involving the musculoskeletal system     Problem List Patient Active Problem List   Diagnosis Date Noted  . PVD (peripheral vascular disease) ([place]) 11/05/2013   12:14 PM,02/11/16 Elly Modena PT, DPT [facility] [place] [address] [place], [place],  [address] Phone: [phone]   Fax:  [phone]  Name: Theresa Morris MRN: [id number] Date of Birth: 25-Sep-1931

## 2016-02-15 ENCOUNTER — Telehealth (HOSPITAL_COMMUNITY): Payer: Self-pay | Admitting: Internal Medicine

## 2016-02-15 NOTE — Telephone Encounter (Signed)
02/15/16 pt cx - she wants to go to an appt with her husband that she says is important in his healthcare

## 2016-02-16 ENCOUNTER — Ambulatory Visit (HOSPITAL_COMMUNITY): Payer: Medicare Other | Admitting: Physical Therapy

## 2016-02-18 ENCOUNTER — Encounter (HOSPITAL_COMMUNITY): Payer: Self-pay

## 2016-02-18 ENCOUNTER — Ambulatory Visit (HOSPITAL_COMMUNITY): Payer: Medicare Other

## 2016-02-18 DIAGNOSIS — R2689 Other abnormalities of gait and mobility: Secondary | ICD-10-CM

## 2016-02-18 DIAGNOSIS — M6281 Muscle weakness (generalized): Secondary | ICD-10-CM | POA: Diagnosis not present

## 2016-02-18 DIAGNOSIS — R29898 Other symptoms and signs involving the musculoskeletal system: Secondary | ICD-10-CM

## 2016-02-18 NOTE — Therapy (Signed)
[place], [place], [address] Phone: [phone]   Fax:  [phone]  Physical Therapy Treatment  Patient Details  Name: Theresa Morris MRN: [id number] Date of Birth: 09/14/1931 Referring Provider: Allyn Kenner, MD  Encounter Date: 02/18/2016      PT End of Session - 02/18/16 1040    Visit Number 8   Number of Visits 13   Date for PT Re-Evaluation 03/04/16   Authorization Type [employer] Medicare *Gcodes done 6th visit    Authorization Time Period 01/22/16 to 03/04/16   PT Start Time 1037   PT Stop Time 1116   PT Time Calculation (min) 39 min   Activity Tolerance No increased pain;Patient tolerated treatment well   Behavior During Therapy [facility] for tasks assessed/performed      Past Medical History:  Diagnosis Date  . Arthritis   . Complication of anesthesia    was told she had a small airway  . Glaucoma   . Hyperlipemia   . Hypertension   . TIA (transient ischemic attack)    had a finger go limp-was told tia.    Past Surgical History:  Procedure Laterality Date  . CESAREAN SECTION    . EYE SURGERY  2009   rt eye  . TONSILLECTOMY    . TRIGGER FINGER RELEASE  09/27/2011   Procedure: RELEASE TRIGGER FINGER/A-1 PULLEY;  Surgeon: Wynonia Sours, MD;  Location: [place];  Service: Orthopedics;  Laterality: Left;  Left middle finger    There were no vitals filed for this visit.      Subjective Assessment - 02/18/16 1039    Subjective Pt states that she hates she missed last treatment session. She reports that she was feeling "rough and so stiff this morning" but she has limbered up some since.   Pertinent History TIA, arthritis, glaucoma, HTN, HLD   Limitations Other (comment)   Currently in Pain? No/denies                         [facility] Adult PT Treatment/Exercise - 02/18/16 0001      Knee/Hip Exercises: Standing   Hip Abduction Stengthening;Both;2 sets;10 reps  RTB; cues  to decrease toe out on LLE   Forward Step Up Both;1 set;Step Height: 4";Step Height: 6"  x 8 on 4" with no HHA, x 5 on 6"step intermittent UE support     Knee/Hip Exercises: Sidelying   Hip ABduction Left;3 sets;Right;2 sets;10 reps  isometric holds x 6 sec     Manual Therapy   Manual Therapy Soft tissue mobilization   Manual therapy comments performed separately from all other interventions.    Soft tissue mobilization STM to L glute med/max, proximal ITB x 15 mins; performed trigger point ischemic release and cross friction with and without ball                 PT Education - 02/18/16 1213    Education provided Yes   Education Details proper exercise technique, continue HEP   Person(s) Educated Patient   Methods Explanation;Demonstration   Comprehension Verbalized understanding;Returned demonstration          PT Short Term Goals - 02/09/16 1049      PT SHORT TERM GOAL #1   Title Pt will demo consistency and independence with her HEP, to improve BLE strength and flexibility.   Time 2   Period Weeks   Status On-going  PT SHORT TERM GOAL #2   Title Pt will demo improved BLE strength to atleast 4/5 MMT, to increase safety with stair negotiation at home.    Time 3   Period Weeks   Status Achieved     PT SHORT TERM GOAL #3   Title pt will demo improved single leg balance evident by her ability to maintain SLS on each LE for atleast 10 sec, 2/3 trials.    Baseline Rt: 6-7 sec  Lt: 7-9 sec    Time 3   Period Weeks   Status Partially Met           PT Long Term Goals - 02/09/16 1050      PT LONG TERM GOAL #1   Title Pt will demo improved BLE strength to atleast 4+/5 MMT to improve safety with functional activity at home and in the community.   Time 6   Period Weeks   Status New     PT LONG TERM GOAL #2   Title Pt will demo improved hamstring flexibility to lacking no more than 25 deg at 90/90 testing, to improve her sitting/standing posture.   Time 6    Period Weeks   Status Achieved     PT LONG TERM GOAL #3   Title Pt will demo improved balance and strength evident by her ability to ascend/descend 4, 6" steps while holding no more than 5# and without noted LOB/unsteadiness, 2/3 trials.    Time 6   Period Weeks   Status Unable to assess               Plan - 02/18/16 1214    Clinical Impression Statement PT performed L glute med/max and proximal ITB STM, focusing mainly at glute insertion near greater trochanter, which pt tolerated well. Isometric clamshells given to pt this date to continue to maximize glute strength; pt demonstrated fatigue on L following exercise. Had pt perform step ups with no HHA this date; she had very intermittent unsteadiness but she demonstrated improper mechanics as she had increased hip IR t/o on BLE. Cued pt to maintain knee over toes t/o which she was able to demonstrate understanding. Pt had increased muscle fatigue at end of session but denied any increase in pain.   Rehab Potential Good   PT Frequency 2x / week   PT Duration 6 weeks  decrease to 1x/week as deemed appropriate by evaluating therapist   PT Treatment/Interventions ADLs/Self Care Home Management;Moist Heat;Gait training;Stair training;Functional mobility training;Therapeutic activities;Patient/family education;Neuromuscular re-education;Balance training;Manual techniques;Dry needling;Passive range of motion   PT Next Visit Plan step up with knee drive, STM Lt ITB   PT Home Exercise Plan supine bridge with red TB around knees 2x15 reps, sidelying clamshells 2x10 reps with red TB   Consulted and Agree with Plan of Care Patient      Patient will benefit from skilled therapeutic intervention in order to improve the following deficits and impairments:  Decreased activity tolerance, Decreased balance, Decreased endurance, Decreased strength, Impaired flexibility, Improper body mechanics, Pain, Postural dysfunction, Decreased mobility  Visit  Diagnosis: Muscle weakness (generalized)  Other abnormalities of gait and mobility  Other symptoms and signs involving the musculoskeletal system     Problem List Patient Active Problem List   Diagnosis Date Noted  . PVD (peripheral vascular disease) ([place]) 11/05/2013    Geraldine Solar PT, DPT  [place] [address] [place], [place], [address] Phone: [phone]   Fax:  [phone]  Name: Theresa Morris MRN: [id number] Date of Birth: 1931-05-20

## 2016-02-23 ENCOUNTER — Ambulatory Visit (HOSPITAL_COMMUNITY): Payer: Medicare Other | Admitting: Physical Therapy

## 2016-02-23 DIAGNOSIS — R2689 Other abnormalities of gait and mobility: Secondary | ICD-10-CM

## 2016-02-23 DIAGNOSIS — M6281 Muscle weakness (generalized): Secondary | ICD-10-CM

## 2016-02-23 DIAGNOSIS — R29898 Other symptoms and signs involving the musculoskeletal system: Secondary | ICD-10-CM

## 2016-02-23 NOTE — Therapy (Signed)
[place], [place], [address] Phone: [phone]   Fax:  [phone]  Physical Therapy Treatment  Patient Details  Name: Theresa Morris MRN: [id number] Date of Birth: May 02, 1931 Referring Provider: Allyn Kenner, MD  Encounter Date: 02/23/2016      PT End of Session - 02/23/16 1208    Visit Number 9   Number of Visits 13   Date for PT Re-Evaluation 03/04/16   Authorization Type [employer] Medicare *Gcodes done 6th visit    Authorization Time Period 01/22/16 to 03/04/16   PT Start Time 1035   PT Stop Time 1115   PT Time Calculation (min) 40 min   Equipment Utilized During Treatment Gait belt   Activity Tolerance No increased pain;Patient tolerated treatment well   Behavior During Therapy [facility] for tasks assessed/performed      Past Medical History:  Diagnosis Date  . Arthritis   . Complication of anesthesia    was told she had a small airway  . Glaucoma   . Hyperlipemia   . Hypertension   . TIA (transient ischemic attack)    had a finger go limp-was told tia.    Past Surgical History:  Procedure Laterality Date  . CESAREAN SECTION    . EYE SURGERY  2009   rt eye  . TONSILLECTOMY    . TRIGGER FINGER RELEASE  09/27/2011   Procedure: RELEASE TRIGGER FINGER/A-1 PULLEY;  Surgeon: Wynonia Sours, MD;  Location: [place];  Service: Orthopedics;  Laterality: Left;  Left middle finger    There were no vitals filed for this visit.      Subjective Assessment - 02/23/16 1106    Subjective Pt reports things are going well. She has been extra busy with caring for her husband and noted some difficutlies with the stairs by the end of the day.    Pertinent History TIA, arthritis, glaucoma, HTN, HLD   Limitations Other (comment)   Currently in Pain? No/denies                         [facility] Adult PT Treatment/Exercise - 02/23/16 0001      Knee/Hip Exercises: Standing   Forward Step Up  Both;Step Height: 6";Hand Hold: 0;10 reps   Forward Step Up Limitations opposite knee drive, x2 sets on Lt, x1 set on Rt    Other Standing Knee Exercises standing hip hike x15 reps each    Other Standing Knee Exercises tandem hold x30 sec each LE forward; tanden hold with trunk rotation holding blue weighted ball x10 reps with each LE forward      Knee/Hip Exercises: Seated   Other Seated Knee/Hip Exercises calmshells 2x15 reps with blue TB    Sit to Sand 10 reps;without UE support;Other (comment)  green band around knees to decrease adduction      Manual Therapy   Manual Therapy Soft tissue mobilization   Manual therapy comments performed separately from all other interventions.    Joint Mobilization Grade 2 central PA sacral mobilizations    Soft tissue mobilization STM Lt glute max, glute Med and TFL                 PT Education - 02/23/16 1207    Education provided Yes   Education Details discussed importance of managing stiffness and pain in the mornings with HEP; technique with HEP    Person(s) Educated Patient   Methods Explanation;Demonstration;Verbal  cues   Comprehension Verbalized understanding;Returned demonstration          PT Short Term Goals - 02/09/16 1049      PT SHORT TERM GOAL #1   Title Pt will demo consistency and independence with her HEP, to improve BLE strength and flexibility.   Time 2   Period Weeks   Status On-going     PT SHORT TERM GOAL #2   Title Pt will demo improved BLE strength to atleast 4/5 MMT, to increase safety with stair negotiation at home.    Time 3   Period Weeks   Status Achieved     PT SHORT TERM GOAL #3   Title pt will demo improved single leg balance evident by her ability to maintain SLS on each LE for atleast 10 sec, 2/3 trials.    Baseline Rt: 6-7 sec  Lt: 7-9 sec    Time 3   Period Weeks   Status Partially Met           PT Long Term Goals - 02/09/16 1050      PT LONG TERM GOAL #1   Title Pt will demo  improved BLE strength to atleast 4+/5 MMT to improve safety with functional activity at home and in the community.   Time 6   Period Weeks   Status New     PT LONG TERM GOAL #2   Title Pt will demo improved hamstring flexibility to lacking no more than 25 deg at 90/90 testing, to improve her sitting/standing posture.   Time 6   Period Weeks   Status Achieved     PT LONG TERM GOAL #3   Title Pt will demo improved balance and strength evident by her ability to ascend/descend 4, 6" steps while holding no more than 5# and without noted LOB/unsteadiness, 2/3 trials.    Time 6   Period Weeks   Status Unable to assess               Plan - 02/23/16 1209    Clinical Impression Statement Today's session continued with focus on hip and trunk strength during functional activity. Increased step height during functional step ups and pt was able to perform with report of only muscle fatigue on the Lt by the end of this activity. Also performed manual techniques to address soft tissue restrictions. Pt reporting no increase in pain following her session. Will continue with current POC.   Rehab Potential Good   PT Frequency 2x / week   PT Duration 6 weeks  decrease to 1x/week as deemed appropriate by evaluating therapist   PT Treatment/Interventions ADLs/Self Care Home Management;Moist Heat;Gait training;Stair training;Functional mobility training;Therapeutic activities;Patient/family education;Neuromuscular re-education;Balance training;Manual techniques;Dry needling;Passive range of motion   PT Next Visit Plan trunk rotation strength with TB in sitting and standing positions; side stepping with TB   PT Home Exercise Plan supine bridge with red TB around knees 2x15 reps, sidelying clamshells 2x10 reps with red TB   Consulted and Agree with Plan of Care Patient      Patient will benefit from skilled therapeutic intervention in order to improve the following deficits and impairments:  Decreased  activity tolerance, Decreased balance, Decreased endurance, Decreased strength, Impaired flexibility, Improper body mechanics, Pain, Postural dysfunction, Decreased mobility  Visit Diagnosis: Muscle weakness (generalized)  Other symptoms and signs involving the musculoskeletal system  Other abnormalities of gait and mobility     Problem List Patient Active Problem List   Diagnosis Date Noted  .  PVD (peripheral vascular disease) ([place]) 11/05/2013    12:14 PM,02/23/16 Elly Modena PT, DPT [facility] [place] [address] [place], [place], [address] Phone: [phone]   Fax:  [phone]  Name: Theresa Morris MRN: [id number] Date of Birth: [birth year]

## 2016-02-25 ENCOUNTER — Ambulatory Visit (HOSPITAL_COMMUNITY): Payer: Medicare Other | Admitting: Physical Therapy

## 2016-02-25 DIAGNOSIS — R29898 Other symptoms and signs involving the musculoskeletal system: Secondary | ICD-10-CM

## 2016-02-25 DIAGNOSIS — M6281 Muscle weakness (generalized): Secondary | ICD-10-CM

## 2016-02-25 DIAGNOSIS — R2689 Other abnormalities of gait and mobility: Secondary | ICD-10-CM

## 2016-02-25 NOTE — Therapy (Signed)
[facility] [place], [place], [address] Phone: [phone]   Fax:  [phone]  Physical Therapy Treatment  Patient Details  Name: Theresa Morris MRN: [id number] Date of Birth: 07-Jan-1931 Referring Provider: Allyn Kenner, MD  Encounter Date: 02/25/2016      PT End of Session - 02/25/16 1045    Visit Number 10   Number of Visits 13   Date for PT Re-Evaluation 03/04/16   Authorization Type [employer] Medicare *Gcodes done 6th visit    Authorization Time Period 01/22/16 to 03/04/16   PT Start Time 1031   PT Stop Time 1114   PT Time Calculation (min) 43 min   Equipment Utilized During Treatment Gait belt   Activity Tolerance No increased pain;Patient tolerated treatment well   Behavior During Therapy [facility] for tasks assessed/performed      Past Medical History:  Diagnosis Date  . Arthritis   . Complication of anesthesia    was told she had a small airway  . Glaucoma   . Hyperlipemia   . Hypertension   . TIA (transient ischemic attack)    had a finger go limp-was told tia.    Past Surgical History:  Procedure Laterality Date  . CESAREAN SECTION    . EYE SURGERY  2009   rt eye  . TONSILLECTOMY    . TRIGGER FINGER RELEASE  09/27/2011   Procedure: RELEASE TRIGGER FINGER/A-1 PULLEY;  Surgeon: Wynonia Sours, MD;  Location: [place];  Service: Orthopedics;  Laterality: Left;  Left middle finger    There were no vitals filed for this visit.      Subjective Assessment - 02/25/16 1039    Subjective Pt reports that things are going well. Her husband fell the other night and she didn't get much sleep. She is not sure why her sacrum will hurt her in the mornings and sometimes in the evening.    Pertinent History TIA, arthritis, glaucoma, HTN, HLD   Limitations Other (comment)   Currently in Pain? No/denies                         [facility] Adult PT Treatment/Exercise - 02/25/16 0001      Knee/Hip  Exercises: Standing   Step Down 1 set;Both;10 reps;Hand Hold: 2;Step Height: 6"   Step Down Limitations lateral step down  (+) hip rotation on Lt   SLS SLS on each LE 3x20; Rt (~10 sec), Lt (10-14 sec)   Other Standing Knee Exercises UE pressdown with red TB and hip flexion 2x10 reps each    Other Standing Knee Exercises standing hamstring curls 2x15 reps each with 4# ankle weight      Knee/Hip Exercises: Supine   Other Supine Knee/Hip Exercises BKTC 6x10 sec hold; repeated low trunk rotation x10 reps each side                 PT Education - 02/25/16 1208    Education provided Yes   Education Details noted progress and areas that will continue to be the focus in the next several sessions; technique with therex   Person(s) Educated Patient   Methods Explanation   Comprehension Verbalized understanding          PT Short Term Goals - 02/09/16 1049      PT SHORT TERM GOAL #1   Title Pt will demo consistency and independence with her HEP, to improve BLE strength and flexibility.  Time 2   Period Weeks   Status On-going     PT SHORT TERM GOAL #2   Title Pt will demo improved BLE strength to atleast 4/5 MMT, to increase safety with stair negotiation at home.    Time 3   Period Weeks   Status Achieved     PT SHORT TERM GOAL #3   Title pt will demo improved single leg balance evident by her ability to maintain SLS on each LE for atleast 10 sec, 2/3 trials.    Baseline Rt: 6-7 sec  Lt: 7-9 sec    Time 3   Period Weeks   Status Partially Met           PT Long Term Goals - 02/09/16 1050      PT LONG TERM GOAL #1   Title Pt will demo improved BLE strength to atleast 4+/5 MMT to improve safety with functional activity at home and in the community.   Time 6   Period Weeks   Status New     PT LONG TERM GOAL #2   Title Pt will demo improved hamstring flexibility to lacking no more than 25 deg at 90/90 testing, to improve her sitting/standing posture.   Time 6    Period Weeks   Status Achieved     PT LONG TERM GOAL #3   Title Pt will demo improved balance and strength evident by her ability to ascend/descend 4, 6" steps while holding no more than 5# and without noted LOB/unsteadiness, 2/3 trials.    Time 6   Period Weeks   Status Unable to assess               Plan - 02/25/16 1210    Clinical Impression Statement Continued this session with exercises to improve single leg strength and stability. Pt demonstrating improvements in this, evident by her ability to maintain single leg stance greater than 10 sec without LOB.  She does continue to display hip rotational weakness during step down activity, causing increased discomfort along her greater trochanter. She was unable to correct this with just verbal/tactile cues and would benefit from decrease in step height during her next session. Ended without report of pain/discomfort.   Rehab Potential Good   PT Frequency 2x / week   PT Duration 6 weeks  decrease to 1x/week as deemed appropriate by evaluating therapist   PT Treatment/Interventions ADLs/Self Care Home Management;Moist Heat;Gait training;Stair training;Functional mobility training;Therapeutic activities;Patient/family education;Neuromuscular re-education;Balance training;Manual techniques;Dry needling;Passive range of motion   PT Next Visit Plan hip ER strength in standing, Lt piriformis stretch, step down with 4" box and attention to knee valgus    PT Home Exercise Plan supine bridge with red TB around knees 2x15 reps, sidelying clamshells 2x10 reps with red TB   Consulted and Agree with Plan of Care Patient      Patient will benefit from skilled therapeutic intervention in order to improve the following deficits and impairments:  Decreased activity tolerance, Decreased balance, Decreased endurance, Decreased strength, Impaired flexibility, Improper body mechanics, Pain, Postural dysfunction, Decreased mobility  Visit Diagnosis: Other  symptoms and signs involving the musculoskeletal system  Muscle weakness (generalized)  Other abnormalities of gait and mobility     Problem List Patient Active Problem List   Diagnosis Date Noted  . PVD (peripheral vascular disease) ([place]) 11/05/2013    12:17 PM,02/25/16 Elly Modena PT, DPT [facility] [place] [address] [place], [place], [address] Phone: [phone]   Fax:  [phone]  Name: Theresa Morris MRN: [id number] Date of Birth: 07-Apr-1931

## 2016-03-01 ENCOUNTER — Telehealth (HOSPITAL_COMMUNITY): Payer: Self-pay | Admitting: Internal Medicine

## 2016-03-01 ENCOUNTER — Ambulatory Visit (HOSPITAL_COMMUNITY): Payer: Medicare Other | Admitting: Physical Therapy

## 2016-03-01 NOTE — Telephone Encounter (Signed)
03/01/16 pt called to cx - she said that it was urgent that she cancel this morning

## 2016-03-03 ENCOUNTER — Ambulatory Visit (HOSPITAL_COMMUNITY): Payer: Medicare Other | Attending: Internal Medicine | Admitting: Physical Therapy

## 2016-03-03 DIAGNOSIS — M6281 Muscle weakness (generalized): Secondary | ICD-10-CM | POA: Insufficient documentation

## 2016-03-03 DIAGNOSIS — R29898 Other symptoms and signs involving the musculoskeletal system: Secondary | ICD-10-CM | POA: Diagnosis present

## 2016-03-03 DIAGNOSIS — R2689 Other abnormalities of gait and mobility: Secondary | ICD-10-CM | POA: Diagnosis present

## 2016-03-03 NOTE — Therapy (Signed)
[place], [place], [address] Phone: [phone]   Fax:  [phone]  Physical Therapy Treatment/Discharge  Patient Details  Name: Theresa Morris MRN: [id number] Date of Birth: 06-Jun-1931 Referring Provider: Allyn Kenner, MD  Encounter Date: 03/03/2016      PT End of Session - 03/03/16 1139    Visit Number 11   Number of Visits 13   Date for PT Re-Evaluation 03/04/16   Authorization Type UHC Medicare *Gcodes done 6th visit    Authorization Time Period 01/22/16 to 03/04/16   PT Start Time 1035   PT Stop Time 1116   PT Time Calculation (min) 41 min   Equipment Utilized During Treatment Gait belt   Activity Tolerance No increased pain;Patient tolerated treatment well   Behavior During Therapy [facility] for tasks assessed/performed      Past Medical History:  Diagnosis Date  . Arthritis   . Complication of anesthesia    was told she had a small airway  . Glaucoma   . Hyperlipemia   . Hypertension   . TIA (transient ischemic attack)    had a finger go limp-was told tia.    Past Surgical History:  Procedure Laterality Date  . CESAREAN SECTION    . EYE SURGERY  2009   rt eye  . TONSILLECTOMY    . TRIGGER FINGER RELEASE  09/27/2011   Procedure: RELEASE TRIGGER FINGER/A-1 PULLEY;  Surgeon: Wynonia Sours, MD;  Location: [place];  Service: Orthopedics;  Laterality: Left;  Left middle finger    There were no vitals filed for this visit.      Subjective Assessment - 03/03/16 1041    Subjective Pt reports that her husband was having a rough time this past week. She feels like she is physically and emotionally drained and is going to consider speaking to her husbands PCP about possibly getting some assistance at home.    Pertinent History TIA, arthritis, glaucoma, HTN, HLD   Limitations Other (comment)   Currently in Pain? No/denies  when she first gets up mid low back pain is higher and by 10 or  so in the morning it is much better.             [facility] PT Assessment - 03/03/16 0001      Assessment   Medical Diagnosis Multiple falls    Referring Provider Allyn Kenner, MD   Onset Date/Surgical Date --  several months ago.   Next MD Visit none as of now    Prior Therapy None, saw chiropractor     Precautions   Precautions None     Balance Screen   Has the patient fallen in the past 6 months No   Has the patient had a decrease in activity level because of a fear of falling?  No   Is the patient reluctant to leave their home because of a fear of falling?  No     Home Environment   Additional Comments stairs to reach bedroom, 5 steps then landing, followed by 7-8 steps      Prior Function   Level of Independence Independent   Leisure helps care for her husband      Cognition   Overall Cognitive Status Within Functional Limits for tasks assessed     Sensation   Light Touch Appears Intact     Functional Tests   Functional tests Single leg stance     Single Leg Stance  Comments SLS: Rt: 15 sec, Lt: 15 sec      AROM   Lumbar Extension --     Strength   Right Hip Flexion 5/5   Right Hip Extension 4+/5   Right Hip ABduction 4+/5   Left Hip Flexion 5/5   Left Hip Extension 4+/5   Left Hip ABduction 4+/5   Right Knee Extension 5/5   Left Knee Extension 5/5   Right Ankle Dorsiflexion 5/5   Left Ankle Dorsiflexion 5/5     Flexibility   Soft Tissue Assessment /Muscle Length yes   Hamstrings BLE 25 deg lacking    Quadriceps WNL   Piriformis WNL     Palpation   Spinal mobility --   SI assessment  none tender with palpation   Palpation comment non tender with palpation along gluteal region, B hip regions      Special Tests    Special Tests Lumbar   Lumbar Tests t     Prone Knee Bend Test   Findings --     Straight Leg Raise   Findings --     Transfers   Five time sit to stand comments  9.5 sec, no UE (past session)     Standardized Balance Assessment    Standardized Balance Assessment Timed Up and Go Test     Timed Up and Go Test   TUG Comments 9 sec, no AD (past session)                     [facility] Adult PT Treatment/Exercise - 03/03/16 0001      Ambulation/Gait   Gait Comments Ascend/descend 6" steps x2 RT with and without 1 handrail, noting no LOB or unsteadiness. Also able to ascend/descend x1 RT while holding 5# weight, no unsteadiness or difficulty noted.                PT Education - 03/03/16 1136    Education provided Yes   Education Details importance of finding services to assist with caring for her husband; noted improvements in all areas evaluated back at the start of PT, goals met and discharge to allow pt to continue with exercise independently at home.     Person(s) Educated Patient   Methods Explanation   Comprehension Verbalized understanding          PT Short Term Goals - 03/03/16 1102      PT SHORT TERM GOAL #1   Title Pt will demo consistency and independence with her HEP, to improve BLE strength and flexibility.   Time 2   Period Weeks   Status On-going     PT SHORT TERM GOAL #2   Title Pt will demo improved BLE strength to atleast 4/5 MMT, to increase safety with stair negotiation at home.    Time 3   Period Weeks   Status Achieved     PT SHORT TERM GOAL #3   Title pt will demo improved single leg balance evident by her ability to maintain SLS on each LE for atleast 10 sec, 2/3 trials.    Baseline 15 sec each   Time 3   Period Weeks   Status Achieved           PT Long Term Goals - 03/03/16 1103      PT LONG TERM GOAL #1   Title Pt will demo improved BLE strength to atleast 4+/5 MMT to improve safety with functional activity at home and in the community.  Time 6   Period Weeks   Status Achieved     PT LONG TERM GOAL #2   Title Pt will demo improved hamstring flexibility to lacking no more than 25 deg at 90/90 testing, to improve her sitting/standing posture.    Time 6   Period Weeks   Status Achieved     PT LONG TERM GOAL #3   Title Pt will demo improved balance and strength evident by her ability to ascend/descend 4, 6" steps while holding no more than 5# and without noted LOB/unsteadiness, 2/3 trials.    Time 6   Period Weeks   Status Achieved               Plan - Mar 16, 2016 1142    Clinical Impression Statement Pt has made good progress since beginning PT, having met all of her short and long term goals with improved functional performance and tolerance. She reports occasional midline, low back/sacral pain early in the mornings, however this appears to be more related to arthritic changes and improves as the day goes on. She demonstrates near full BLE strength and is able to ascend/descend stairs safely and while holding up to 5lbs without any unsteadiness or LOB. She currently has a large stressor at home as she is caring for her husband who has a list of medical issues. She is unable to find time much time for herself as she has devoted her time to caring for him 24/7. Although family has been willing to assist as often as they are able, I encouraged her to seek out additional assistance to help with her husband's physical assistance needs around the house. She verbalized understanding with this. She feels she is capable of maintaining her current functional level independently at home and is in agreement with discharge from PT at this time.   Rehab Potential Good   PT Frequency 2x / week   PT Duration 6 weeks  decrease to 1x/week as deemed appropriate by evaluating therapist   PT Treatment/Interventions ADLs/Self Care Home Management;Moist Heat;Gait training;Stair training;Functional mobility training;Therapeutic activities;Patient/family education;Neuromuscular re-education;Balance training;Manual techniques;Dry needling;Passive range of motion   PT Next Visit Plan discharged with HEP   PT Home Exercise Plan supine bridge with red TB around  knees 2x15 reps, sidelying clamshells 2x10 reps with red TB, step up    Consulted and Agree with Plan of Care Patient      Patient will benefit from skilled therapeutic intervention in order to improve the following deficits and impairments:  Decreased activity tolerance, Decreased balance, Decreased endurance, Decreased strength, Impaired flexibility, Improper body mechanics, Pain, Postural dysfunction, Decreased mobility  Visit Diagnosis: Other symptoms and signs involving the musculoskeletal system  Muscle weakness (generalized)  Other abnormalities of gait and mobility       G-Codes - 03/16/16 1247    Functional Assessment Tool Used (Outpatient Only) Clinical judgement based on assesssment of strength, mobility and functional testing.    Functional Limitation Mobility: Walking and moving around   Mobility: Walking and Moving Around Goal Status [phone]) At least 1 percent but less than 20 percent impaired, limited or restricted   Mobility: Walking and Moving Around Discharge Status [phone]) At least 1 percent but less than 20 percent impaired, limited or restricted      Problem List Patient Active Problem List   Diagnosis Date Noted  . PVD (peripheral vascular disease) ([place]) 11/05/2013   12:52 PM,16-Mar-2016 Elly Modena PT, DPT [facility] [place]  [facility] [place], [place], [address] Phone: [phone]   Fax:  [phone]  Name: Theresa Morris MRN: [id number] Date of Birth: 1931-05-03

## 2016-03-07 ENCOUNTER — Encounter (HOSPITAL_COMMUNITY): Payer: Medicare Other | Admitting: Physical Therapy

## 2016-03-15 ENCOUNTER — Encounter (HOSPITAL_COMMUNITY): Payer: Medicare Other | Admitting: Physical Therapy

## 2016-03-17 ENCOUNTER — Encounter (HOSPITAL_COMMUNITY): Payer: Medicare Other | Admitting: Physical Therapy

## 2016-06-08 ENCOUNTER — Ambulatory Visit (INDEPENDENT_AMBULATORY_CARE_PROVIDER_SITE_OTHER): Payer: Medicare Other | Admitting: Ophthalmology

## 2016-06-08 DIAGNOSIS — H35371 Puckering of macula, right eye: Secondary | ICD-10-CM | POA: Diagnosis not present

## 2016-06-08 DIAGNOSIS — H35033 Hypertensive retinopathy, bilateral: Secondary | ICD-10-CM | POA: Diagnosis not present

## 2016-06-08 DIAGNOSIS — H43812 Vitreous degeneration, left eye: Secondary | ICD-10-CM

## 2016-06-08 DIAGNOSIS — H353132 Nonexudative age-related macular degeneration, bilateral, intermediate dry stage: Secondary | ICD-10-CM | POA: Diagnosis not present

## 2016-06-08 DIAGNOSIS — I10 Essential (primary) hypertension: Secondary | ICD-10-CM | POA: Diagnosis not present

## 2016-06-10 ENCOUNTER — Ambulatory Visit (INDEPENDENT_AMBULATORY_CARE_PROVIDER_SITE_OTHER): Payer: Medicare Other | Admitting: Ophthalmology

## 2016-09-24 ENCOUNTER — Encounter (HOSPITAL_COMMUNITY): Payer: Self-pay | Admitting: Emergency Medicine

## 2016-09-24 ENCOUNTER — Emergency Department (HOSPITAL_COMMUNITY)
Admission: EM | Admit: 2016-09-24 | Discharge: 2016-09-24 | Disposition: A | Discharge status: Home / Self Care | Payer: Medicare Other | Attending: Emergency Medicine | Admitting: Emergency Medicine

## 2016-09-24 ENCOUNTER — Emergency Department (HOSPITAL_COMMUNITY): Payer: Medicare Other

## 2016-09-24 DIAGNOSIS — Y93H1 Activity, digging, shoveling and raking: Secondary | ICD-10-CM | POA: Insufficient documentation

## 2016-09-24 DIAGNOSIS — Y929 Unspecified place or not applicable: Secondary | ICD-10-CM | POA: Insufficient documentation

## 2016-09-24 DIAGNOSIS — S52501A Unspecified fracture of the lower end of right radius, initial encounter for closed fracture: Secondary | ICD-10-CM

## 2016-09-24 DIAGNOSIS — Z79899 Other long term (current) drug therapy: Secondary | ICD-10-CM | POA: Insufficient documentation

## 2016-09-24 DIAGNOSIS — Z7902 Long term (current) use of antithrombotics/antiplatelets: Secondary | ICD-10-CM | POA: Insufficient documentation

## 2016-09-24 DIAGNOSIS — W010XXA Fall on same level from slipping, tripping and stumbling without subsequent striking against object, initial encounter: Secondary | ICD-10-CM | POA: Diagnosis not present

## 2016-09-24 DIAGNOSIS — Y999 Unspecified external cause status: Secondary | ICD-10-CM | POA: Diagnosis not present

## 2016-09-24 DIAGNOSIS — I10 Essential (primary) hypertension: Secondary | ICD-10-CM | POA: Diagnosis not present

## 2016-09-24 DIAGNOSIS — S59911A Unspecified injury of right forearm, initial encounter: Secondary | ICD-10-CM | POA: Diagnosis present

## 2016-09-24 NOTE — ED Triage Notes (Signed)
Pt reports was getting up limbs in the yard yesterday and reports tripped over some vines. Pt reports right forearm pain ever since. Moderate redness noted to right forearm. No obvious deformity noted.

## 2016-09-24 NOTE — Discharge Instructions (Signed)
You have fractured the radius bone.   Splint, ice, elevation, ibuprofen. I've given the number of the orthopedic surgeon on-call for the emergency department. Otherwise, you can follow-up with [facility]

## 2016-09-24 NOTE — ED Provider Notes (Signed)
[facility]Y DEPT Provider Note   CSN: [id number] Arrival date & time: 09/24/16  4098     History   Chief Complaint Chief Complaint  Patient presents with  . Arm Pain    HPI Theresa Morris is a 83 y.o. female.  Accidental trip and fall while raking up limbs yesterday.  Now patient complains of right wrist pain. No head or neck injuries.. Nothing makes symptoms better or worse.      Past Medical History:  Diagnosis Date  . Arthritis   . Complication of anesthesia    was told she had a small airway  . Glaucoma   . Hyperlipemia   . Hypertension   . TIA (transient ischemic attack)    had a finger go limp-was told tia.    Patient Active Problem List   Diagnosis Date Noted  . PVD (peripheral vascular disease) ([facility]) 11/05/2013    Past Surgical History:  Procedure Laterality Date  . CESAREAN SECTION    . EYE SURGERY  2009   rt eye  . TONSILLECTOMY    . TRIGGER FINGER RELEASE  09/27/2011   Procedure: RELEASE TRIGGER FINGER/A-1 PULLEY;  Surgeon: Nicki Reaper, MD;  Location: [facility];  Service: Orthopedics;  Laterality: Left;  Left middle finger    OB History    No data available       Home Medications    Prior to Admission medications   Medication Sig Start Date End Date Taking? Authorizing Provider  furosemide (LASIX) 20 MG tablet Take 20 mg by mouth daily.   Yes [provider]  pravastatin (PRAVACHOL) 40 MG tablet Take 40 mg by mouth daily.   Yes [provider]    Family History History reviewed. No pertinent family history.  Social History Social History  Substance Use Topics  . Smoking status: Never Smoker  . Smokeless tobacco: Never Used  . Alcohol use No     Allergies   Patient has no known allergies.   Review of Systems Review of Systems  All other systems reviewed and are negative.    Physical Exam Updated Vital Signs BP (!) 174/67 (BP Location: Left Arm)   Pulse 65   Temp 98.6 F (37  C) (Oral)   Resp 18   Ht  (1.549 m)   Wt 59 kg (130 lb)   SpO2 97%   BMI 24.56 kg/m   Physical Exam  Constitutional: She is oriented to person, place, and time. She appears well-developed and well-nourished.  HENT:  Head: Normocephalic and atraumatic.  Eyes: Conjunctivae are normal.  Neck: Neck supple.  Cardiovascular: Normal rate and regular rhythm.   Pulmonary/Chest: Effort normal and breath sounds normal.  Abdominal: Soft. Bowel sounds are normal.  Musculoskeletal:  Right upper extremity: Tenderness surrounding the radius, especially on the radial aspect  Neurological: She is alert and oriented to person, place, and time.  Skin: Skin is warm and dry.  Psychiatric: She has a normal mood and affect. Her behavior is normal.  Nursing note and vitals reviewed.    ED Treatments / Results  Labs (all labs ordered are listed, but only abnormal results are displayed) Labs Reviewed - No data to display  EKG  EKG Interpretation None       Radiology Dg Forearm Right  Result Date: 09/24/2016 CLINICAL DATA:  Pain after trauma EXAM: RIGHT FOREARM - 2 VIEW COMPARISON:  None. FINDINGS: A tiny density in the soft tissues distal to the ulnar styloid is  identified. Deformity of the distal radius is consistent with a mildly impacted nondisplaced fracture. IMPRESSION: Nondisplaced mildly impacted fracture of the distal radius. The density in the soft tissues distal to the ulnar styloid could represent a tiny fracture fragment. This finding is age indeterminate. Electronically Signed   By: Gerome Sam III M.D   On: 09/24/2016 10:18    Procedures Procedures (including critical care time)  Medications Ordered in ED Medications - No data to display   Initial Impression / Assessment and Plan / ED Course  I have reviewed the triage vital signs and the nursing notes.  Pertinent labs & imaging results that were available during my care of the patient were reviewed by me and  considered in my medical decision making (see chart for details).     Plain films of right wrist reveal a nondisplaced mildly impacted fracture of the distal radius. Wrist was splinted Ice, elevate, ibuprofen. Referral to orthopedics.  Final Clinical Impressions(s) / ED Diagnoses   Final diagnoses:  Closed fracture of distal end of right radius, unspecified fracture morphology, initial encounter    New Prescriptions Discharge Medication List as of 09/24/2016 11:25 AM       Donnetta Hutching, MD 09/24/16 1259

## 2016-11-11 ENCOUNTER — Other Ambulatory Visit (HOSPITAL_COMMUNITY): Payer: Self-pay | Admitting: Internal Medicine

## 2016-11-11 DIAGNOSIS — Z780 Asymptomatic menopausal state: Secondary | ICD-10-CM

## 2016-11-18 ENCOUNTER — Encounter (HOSPITAL_COMMUNITY): Payer: Self-pay

## 2016-11-18 ENCOUNTER — Other Ambulatory Visit (HOSPITAL_COMMUNITY): Payer: Medicare Other

## 2017-06-12 ENCOUNTER — Ambulatory Visit (INDEPENDENT_AMBULATORY_CARE_PROVIDER_SITE_OTHER): Payer: Medicare Other | Admitting: Ophthalmology

## 2017-06-12 DIAGNOSIS — H353111 Nonexudative age-related macular degeneration, right eye, early dry stage: Secondary | ICD-10-CM

## 2017-06-12 DIAGNOSIS — H353122 Nonexudative age-related macular degeneration, left eye, intermediate dry stage: Secondary | ICD-10-CM | POA: Diagnosis not present

## 2017-06-12 DIAGNOSIS — H35033 Hypertensive retinopathy, bilateral: Secondary | ICD-10-CM | POA: Diagnosis not present

## 2017-06-12 DIAGNOSIS — H35373 Puckering of macula, bilateral: Secondary | ICD-10-CM

## 2017-06-12 DIAGNOSIS — H43812 Vitreous degeneration, left eye: Secondary | ICD-10-CM

## 2017-06-12 DIAGNOSIS — I10 Essential (primary) hypertension: Secondary | ICD-10-CM

## 2017-08-31 DIAGNOSIS — M25512 Pain in left shoulder: Secondary | ICD-10-CM | POA: Insufficient documentation

## 2017-09-06 ENCOUNTER — Other Ambulatory Visit: Payer: Self-pay

## 2017-09-06 ENCOUNTER — Emergency Department (HOSPITAL_COMMUNITY)
Admission: EM | Admit: 2017-09-06 | Discharge: 2017-09-06 | Disposition: A | Discharge status: Home / Self Care | Payer: Medicare Other | Attending: Emergency Medicine | Admitting: Emergency Medicine

## 2017-09-06 ENCOUNTER — Encounter (HOSPITAL_COMMUNITY): Payer: Self-pay | Admitting: Emergency Medicine

## 2017-09-06 ENCOUNTER — Emergency Department (HOSPITAL_COMMUNITY): Payer: Medicare Other

## 2017-09-06 DIAGNOSIS — I10 Essential (primary) hypertension: Secondary | ICD-10-CM | POA: Diagnosis not present

## 2017-09-06 DIAGNOSIS — Z79899 Other long term (current) drug therapy: Secondary | ICD-10-CM | POA: Insufficient documentation

## 2017-09-06 DIAGNOSIS — Z8673 Personal history of transient ischemic attack (TIA), and cerebral infarction without residual deficits: Secondary | ICD-10-CM | POA: Insufficient documentation

## 2017-09-06 DIAGNOSIS — R4781 Slurred speech: Secondary | ICD-10-CM | POA: Diagnosis not present

## 2017-09-06 DIAGNOSIS — R4701 Aphasia: Secondary | ICD-10-CM | POA: Diagnosis not present

## 2017-09-06 DIAGNOSIS — Z7902 Long term (current) use of antithrombotics/antiplatelets: Secondary | ICD-10-CM | POA: Insufficient documentation

## 2017-09-06 LAB — CBC
HCT: 38.6 % (ref 36.0–46.0)
Hemoglobin: 13.1 g/dL (ref 12.0–15.0)
MCH: 30.8 pg (ref 26.0–34.0)
MCHC: 33.9 g/dL (ref 30.0–36.0)
MCV: 90.8 fL (ref 78.0–100.0)
Platelets: 342 10*3/uL (ref 150–400)
RBC: 4.25 MIL/uL (ref 3.87–5.11)
RDW: 12.7 % (ref 11.5–15.5)
WBC: 10.3 10*3/uL (ref 4.0–10.5)

## 2017-09-06 LAB — I-STAT TROPONIN, ED: Troponin i, poc: 0.02 ng/mL (ref 0.00–0.08)

## 2017-09-06 LAB — URINALYSIS, ROUTINE W REFLEX MICROSCOPIC
Bilirubin Urine: NEGATIVE
Glucose, UA: NEGATIVE mg/dL
HGB URINE DIPSTICK: NEGATIVE
Ketones, ur: NEGATIVE mg/dL
Leukocytes, UA: NEGATIVE
Nitrite: NEGATIVE
Protein, ur: NEGATIVE mg/dL
SPECIFIC GRAVITY, URINE: 1.004 — AB (ref 1.005–1.030)
pH: 6 (ref 5.0–8.0)

## 2017-09-06 LAB — COMPREHENSIVE METABOLIC PANEL
ALT: 14 U/L (ref 0–44)
ANION GAP: 6 (ref 5–15)
AST: 18 U/L (ref 15–41)
Albumin: 4.4 g/dL (ref 3.5–5.0)
Alkaline Phosphatase: 107 U/L (ref 38–126)
BUN: 15 mg/dL (ref 8–23)
CHLORIDE: 108 mmol/L (ref 98–111)
CO2: 26 mmol/L (ref 22–32)
Calcium: 9.3 mg/dL (ref 8.9–10.3)
Creatinine, Ser: 0.75 mg/dL (ref 0.44–1.00)
GFR calc Af Amer: >60 mL/min (ref >60)
GFR calc non Af Amer: >60 mL/min (ref >60)
Glucose, Bld: 108 mg/dL — ABNORMAL HIGH (ref 70–99)
Potassium: 4.5 mmol/L (ref 3.5–5.1)
Sodium: 140 mmol/L (ref 135–145)
Total Bilirubin: 0.7 mg/dL (ref 0.3–1.2)
Total Protein: 8 g/dL (ref 6.5–8.1)

## 2017-09-06 LAB — PROTIME-INR
INR: 1
PROTHROMBIN TIME: 13.1 s (ref 11.4–15.2)

## 2017-09-06 LAB — RAPID URINE DRUG SCREEN, HOSP PERFORMED
AMPHETAMINES: NOT DETECTED
BENZODIAZEPINES: NOT DETECTED
Barbiturates: NOT DETECTED
Cocaine: NOT DETECTED
OPIATES: NOT DETECTED
Tetrahydrocannabinol: NOT DETECTED

## 2017-09-06 LAB — DIFFERENTIAL
BASOS PCT: 0 %
Basophils Absolute: 0 10*3/uL (ref 0.0–0.1)
EOS PCT: 3 %
Eosinophils Absolute: 0.3 10*3/uL (ref 0.0–0.7)
Lymphocytes Relative: 30 %
Lymphs Abs: 3.1 10*3/uL (ref 0.7–4.0)
MONO ABS: 0.7 10*3/uL (ref 0.1–1.0)
Monocytes Relative: 7 %
Neutro Abs: 6.2 10*3/uL (ref 1.7–7.7)
Neutrophils Relative %: 60 %

## 2017-09-06 LAB — CBG MONITORING, ED: GLUCOSE-CAPILLARY: 87 mg/dL (ref 70–99)

## 2017-09-06 LAB — APTT: APTT: 27 s (ref 24–36)

## 2017-09-06 MED ORDER — LOSARTAN POTASSIUM 25 MG PO TABS: 25.0000 mg | ORAL_TABLET | Freq: Every day | ORAL | 0 refills | Status: DC

## 2017-09-06 MED ORDER — SODIUM CHLORIDE 0.9 % IV BOLUS
500.0000 mL | Freq: Once | INTRAVENOUS | Status: AC
  Administered 2017-09-06: 500 mL via INTRAVENOUS

## 2017-09-06 MED ORDER — SODIUM CHLORIDE 0.9 % IV SOLN
100.0000 mL/h | INTRAVENOUS | Status: DC
  Administered 2017-09-06: 100 mL/h via INTRAVENOUS

## 2017-09-06 NOTE — ED Provider Notes (Signed)
[facility] EMERGENCY DEPARTMENT Provider Note   CSN: [id number] Arrival date & time: 09/06/17  1659     History   Chief Complaint Chief Complaint  Patient presents with  . Aphasia    HPI Theresa Morris is a 84 y.o. female.  HPI Patient presents after an episode of a aphasia. Patient was in her usual state of health until 8 hours ago, when she had an episode of non-provoked speech difficulty. The severe deficiency was present for several moments, but there were residual slow speech, for several hours. However, the patient has been back to normal for at least the last 4 hours. During that episode, no extremity weakness, no confusion, no syncope. Patient spoke with her physician and was referred here for evaluation. She does have a history of hypertension, but took herself off of her medication about 1 year ago.   Past Medical History:  Diagnosis Date  . Arthritis   . Complication of anesthesia    was told she had a small airway  . Glaucoma   . Hyperlipemia   . Hypertension   . TIA (transient ischemic attack)    had a finger go limp-was told tia.    Patient Active Problem List   Diagnosis Date Noted  . PVD (peripheral vascular disease) (HCC) 11/05/2013    Past Surgical History:  Procedure Laterality Date  . CESAREAN SECTION    . EYE SURGERY  2009   rt eye  . TONSILLECTOMY    . TRIGGER FINGER RELEASE  09/27/2011   Procedure: RELEASE TRIGGER FINGER/A-1 PULLEY;  Surgeon: Nicki Reaper, MD;  Location: [facility];  Service: Orthopedics;  Laterality: Left;  Left middle finger     OB History   None      Home Medications    Prior to Admission medications   Medication Sig Start Date End Date Taking? Authorizing Provider  furosemide (LASIX) 20 MG tablet Take 20 mg by mouth daily.    [provider]  pravastatin (PRAVACHOL) 40 MG tablet Take 40 mg by mouth daily.    [provider]    Family History History reviewed. No  pertinent family history.  Social History Social History   Tobacco Use  . Smoking status: Never Smoker  . Smokeless tobacco: Never Used  Substance Use Topics  . Alcohol use: No  . Drug use: No     Allergies   Patient has no known allergies.   Review of Systems Review of Systems  Constitutional:       Per HPI, otherwise negative  HENT:       Per HPI, otherwise negative  Respiratory:       Per HPI, otherwise negative  Cardiovascular:       Per HPI, otherwise negative  Gastrointestinal: Negative for vomiting.  Endocrine:       Negative aside from HPI  Genitourinary:       Neg aside from HPI   Musculoskeletal:       Per HPI, otherwise negative  Skin: Negative.   Neurological: Positive for speech difficulty. Negative for syncope.     Physical Exam Updated Vital Signs BP (!) 199/81 (BP Location: Right Arm)   Pulse 62   Temp 98 F (36.7 C) (Oral)   Resp 10   Ht 5\' 1"  (1.549 m)   Wt 56.7 kg   SpO2 100%   BMI 23.62 kg/m   Physical Exam  Constitutional: She is oriented to person, place, and time. She appears  well-developed and well-nourished. No distress.  HENT:  Head: Normocephalic and atraumatic.  Eyes: Conjunctivae and EOM are normal.  Cardiovascular: Normal rate and regular rhythm.  Pulmonary/Chest: Effort normal and breath sounds normal. No stridor. No respiratory distress.  Abdominal: She exhibits no distension.  Musculoskeletal: She exhibits no edema.  Neurological: She is alert and oriented to person, place, and time. She displays no atrophy and no tremor. No cranial nerve deficit. She exhibits normal muscle tone. She displays no seizure activity. Coordination normal.  Skin: Skin is warm and dry.  Psychiatric: She has a normal mood and affect.  Nursing note and vitals reviewed.    ED Treatments / Results  Labs (all labs ordered are listed, but only abnormal results are displayed) Labs Reviewed  COMPREHENSIVE METABOLIC PANEL - Abnormal; Notable  for the following components:      Result Value   Glucose, Bld 108 (*)    All other components within normal limits  URINALYSIS, ROUTINE W REFLEX MICROSCOPIC - Abnormal; Notable for the following components:   Color, Urine STRAW (*)    Specific Gravity, Urine 1.004 (*)    All other components within normal limits  PROTIME-INR  APTT  CBC  DIFFERENTIAL  RAPID URINE DRUG SCREEN, HOSP PERFORMED  CBG MONITORING, ED  I-STAT TROPONIN, ED    EKG EKG Interpretation  Date/Time:  Wednesday September 06 2017 17:17:24 EDT Ventricular Rate:  65 PR Interval:  164 QRS Duration: 74 QT Interval:  414 QTC Calculation: 430 R Axis:   18 Text Interpretation:  Normal sinus rhythm Artifact Baseline wander Abnormal ekg Confirmed by Gerhard Munch [phone]) on 09/06/2017 7:15:38 PM   Radiology Ct Head Wo Contrast  Result Date: 09/06/2017 CLINICAL DATA:  Slurred speech EXAM: CT HEAD WITHOUT CONTRAST TECHNIQUE: Contiguous axial images were obtained from the base of the skull through the vertex without intravenous contrast. COMPARISON:  None. FINDINGS: Brain: Global atrophy. Mild chronic ischemic changes in the periventricular white matter. No mass effect, midline shift, or acute intracranial hemorrhage. Vascular: No hyperdense vessel or unexpected calcification. Skull: Cranium is intact. Sinuses/Orbits: No acute finding. Other: None. IMPRESSION: Global atrophy and chronic ischemic changes. No acute intracranial pathology. Electronically Signed   By: Jolaine Click M.D.   On: 09/06/2017 19:59    Procedures Procedures (including critical care time)  Medications Ordered in ED Medications  sodium chloride 0.9 % bolus 500 mL (500 mLs Intravenous New Bag/Given 09/06/17 1957)    Followed by  0.9 %  sodium chloride infusion (100 mL/hr Intravenous New Bag/Given 09/06/17 1957)     Initial Impression / Assessment and Plan / ED Course  I have reviewed the triage vital signs and the nursing notes.  Pertinent labs &  imaging results that were available during my care of the patient were reviewed by me and considered in my medical decision making (see chart for details).     8:24 PM Patient in no distress, awake, alert, smiling. We discussed all findings including reassuring CT scan, labs.  She does have mild persistent hypertension, we discussed options for controlling this.  Patient has previously used Hydrocort thiazide, lisinopril, had unpleasant side effects with both.  She will try losartan. Given some suspicion for possible TIA, patient will also start aspirin, pending further outpatient studies including echocardiogram, carotid ultrasound, possible MRI, which she will pursue with her physician. Patient has strong preference for outpatient follow-up, this is reasonable given the resolution of symptoms, generally benign health, access to follow-up care.  Final Clinical Impressions(s) /  ED Diagnoses  Aphasia   Gerhard Munch, MD 09/06/17 2026

## 2017-09-06 NOTE — Discharge Instructions (Addendum)
As discussed, your evaluation today has been largely reassuring.  But, it is important that you monitor your condition carefully, and do not hesitate to return to the ED if you develop new, or concerning changes in your condition.  In addition to the prescribed losartan, please using aspirin, 81 mg, daily.  Otherwise, please follow-up with your physician for appropriate ongoing care.  Please be sure to discuss the next evaluation and schedule appropriate follow-up studies, including echocardiogram, carotid ultrasound.

## 2017-09-06 NOTE — ED Triage Notes (Signed)
Pt brought in by family d/t slurred speech at 1045 that lasted for several hours. Was referred to ED to rule out TIA. Pt now completely normal per family and pt. No neuro deficits.

## 2017-09-08 ENCOUNTER — Other Ambulatory Visit (HOSPITAL_COMMUNITY): Payer: Self-pay | Admitting: Internal Medicine

## 2017-09-08 DIAGNOSIS — R4701 Aphasia: Secondary | ICD-10-CM

## 2017-09-14 ENCOUNTER — Ambulatory Visit (HOSPITAL_COMMUNITY)
Admission: RE | Admit: 2017-09-14 | Discharge: 2017-09-14 | Disposition: A | Discharge status: Home / Self Care | Payer: Medicare Other | Source: Ambulatory Visit | Attending: Internal Medicine | Admitting: Internal Medicine

## 2017-09-14 DIAGNOSIS — R4701 Aphasia: Secondary | ICD-10-CM | POA: Insufficient documentation

## 2017-09-14 DIAGNOSIS — I6389 Other cerebral infarction: Secondary | ICD-10-CM | POA: Diagnosis not present

## 2017-09-14 MED ORDER — GADOBENATE DIMEGLUMINE 529 MG/ML IV SOLN
10.0000 mL | Freq: Once | INTRAVENOUS | Status: AC | PRN
  Administered 2017-09-14: 5 mL via INTRAVENOUS

## 2018-06-18 ENCOUNTER — Other Ambulatory Visit: Payer: Self-pay

## 2018-06-18 ENCOUNTER — Encounter (INDEPENDENT_AMBULATORY_CARE_PROVIDER_SITE_OTHER): Payer: Medicare Other | Admitting: Ophthalmology

## 2018-06-18 DIAGNOSIS — H35033 Hypertensive retinopathy, bilateral: Secondary | ICD-10-CM | POA: Diagnosis not present

## 2018-06-18 DIAGNOSIS — H35371 Puckering of macula, right eye: Secondary | ICD-10-CM

## 2018-06-18 DIAGNOSIS — H43812 Vitreous degeneration, left eye: Secondary | ICD-10-CM

## 2018-06-18 DIAGNOSIS — H353132 Nonexudative age-related macular degeneration, bilateral, intermediate dry stage: Secondary | ICD-10-CM

## 2018-06-18 DIAGNOSIS — I10 Essential (primary) hypertension: Secondary | ICD-10-CM

## 2019-01-08 DIAGNOSIS — Z1329 Encounter for screening for other suspected endocrine disorder: Secondary | ICD-10-CM | POA: Diagnosis not present

## 2019-01-08 DIAGNOSIS — I10 Essential (primary) hypertension: Secondary | ICD-10-CM | POA: Diagnosis not present

## 2019-01-08 DIAGNOSIS — R7301 Impaired fasting glucose: Secondary | ICD-10-CM | POA: Diagnosis not present

## 2019-01-08 DIAGNOSIS — E782 Mixed hyperlipidemia: Secondary | ICD-10-CM | POA: Diagnosis not present

## 2019-01-14 DIAGNOSIS — R7301 Impaired fasting glucose: Secondary | ICD-10-CM | POA: Diagnosis not present

## 2019-01-14 DIAGNOSIS — I700 Atherosclerosis of aorta: Secondary | ICD-10-CM | POA: Diagnosis not present

## 2019-01-14 DIAGNOSIS — I10 Essential (primary) hypertension: Secondary | ICD-10-CM | POA: Diagnosis not present

## 2019-01-14 DIAGNOSIS — E782 Mixed hyperlipidemia: Secondary | ICD-10-CM | POA: Diagnosis not present

## 2019-01-14 DIAGNOSIS — E875 Hyperkalemia: Secondary | ICD-10-CM | POA: Diagnosis not present

## 2019-01-14 DIAGNOSIS — H40059 Ocular hypertension, unspecified eye: Secondary | ICD-10-CM | POA: Diagnosis not present

## 2019-01-14 DIAGNOSIS — I739 Peripheral vascular disease, unspecified: Secondary | ICD-10-CM | POA: Diagnosis not present

## 2019-02-04 DIAGNOSIS — Z961 Presence of intraocular lens: Secondary | ICD-10-CM | POA: Diagnosis not present

## 2019-02-04 DIAGNOSIS — H00021 Hordeolum internum right upper eyelid: Secondary | ICD-10-CM | POA: Diagnosis not present

## 2019-06-19 ENCOUNTER — Encounter (INDEPENDENT_AMBULATORY_CARE_PROVIDER_SITE_OTHER): Payer: Medicare PPO | Admitting: Ophthalmology

## 2019-06-19 ENCOUNTER — Other Ambulatory Visit: Payer: Self-pay

## 2019-06-19 DIAGNOSIS — H353132 Nonexudative age-related macular degeneration, bilateral, intermediate dry stage: Secondary | ICD-10-CM | POA: Diagnosis not present

## 2019-06-19 DIAGNOSIS — H35373 Puckering of macula, bilateral: Secondary | ICD-10-CM

## 2019-06-19 DIAGNOSIS — I10 Essential (primary) hypertension: Secondary | ICD-10-CM

## 2019-06-19 DIAGNOSIS — H35033 Hypertensive retinopathy, bilateral: Secondary | ICD-10-CM

## 2019-06-19 DIAGNOSIS — H43813 Vitreous degeneration, bilateral: Secondary | ICD-10-CM

## 2019-06-27 DIAGNOSIS — D045 Carcinoma in situ of skin of trunk: Secondary | ICD-10-CM | POA: Diagnosis not present

## 2019-06-27 DIAGNOSIS — L820 Inflamed seborrheic keratosis: Secondary | ICD-10-CM | POA: Diagnosis not present

## 2019-07-05 ENCOUNTER — Ambulatory Visit
Admission: EM | Admit: 2019-07-05 | Discharge: 2019-07-05 | Disposition: A | Discharge status: Home / Self Care | Payer: Medicare PPO

## 2019-07-05 ENCOUNTER — Encounter: Payer: Self-pay | Admitting: Emergency Medicine

## 2019-07-05 ENCOUNTER — Other Ambulatory Visit: Payer: Self-pay

## 2019-07-05 DIAGNOSIS — W5501XA Bitten by cat, initial encounter: Secondary | ICD-10-CM | POA: Diagnosis not present

## 2019-07-05 DIAGNOSIS — S81801A Unspecified open wound, right lower leg, initial encounter: Secondary | ICD-10-CM

## 2019-07-05 DIAGNOSIS — S91001A Unspecified open wound, right ankle, initial encounter: Secondary | ICD-10-CM

## 2019-07-05 DIAGNOSIS — S81001A Unspecified open wound, right knee, initial encounter: Secondary | ICD-10-CM

## 2019-07-05 DIAGNOSIS — S81851A Open bite, right lower leg, initial encounter: Secondary | ICD-10-CM | POA: Diagnosis not present

## 2019-07-05 MED ORDER — DORZOLAMIDE HCL-TIMOLOL MAL 2-0.5 % OP SOLN: 1.0000 [drp] | Freq: Two times a day (BID) | OPHTHALMIC | 2 refills | Status: AC

## 2019-07-05 MED ORDER — AMOXICILLIN-POT CLAVULANATE 875-125 MG PO TABS: 1.0000 | ORAL_TABLET | Freq: Two times a day (BID) | ORAL | 0 refills | Status: DC

## 2019-07-05 NOTE — ED Triage Notes (Signed)
Got scratched by cat x 2 weeks ago on th RT lower shin.  Area is red around the wound and its sore.

## 2019-07-05 NOTE — Discharge Instructions (Addendum)
Wash with warm water and mild soap Augmentin prescribed.  Take as directed and to completion Follow up with PCP if symptoms persists Return or go to the ED if you have any new or worsening symptoms such as increasing redness, swelling, drainage, fever, chills, nausea, chest pain, SOB, etc... 

## 2019-07-05 NOTE — ED Provider Notes (Signed)
[facility] CARE CENTER   [id number] 07/05/19 Arrival Time: 1748   Chief Complaint  Patient presents with   Wound Check     SUBJECTIVE: History from: patient.  Theresa Morris is a 86 y.o. female who presented to the urgent care with a complaint of wound to right lower shin that occurred 2 weeks ago.Report redness and increasing pain.  Denies drainage..  She stated that she was scratched by her cat.    Has tried OTC medication without relief.  Denies similar symptoms in the past.  Denies chills, fever, nausea, vomiting, diarrhea.  ROS: As per HPI.  All other pertinent ROS negative.     Past Medical History:  Diagnosis Date   Arthritis    Complication of anesthesia    was told she had a small airway   Glaucoma    Hyperlipemia    Hypertension    TIA (transient ischemic attack)    had a finger go limp-was told tia.   Past Surgical History:  Procedure Laterality Date   CESAREAN SECTION     EYE SURGERY  2009   rt eye   TONSILLECTOMY     TRIGGER FINGER RELEASE  09/27/2011   Procedure: RELEASE TRIGGER FINGER/A-1 PULLEY;  Surgeon: Nicki Reaper, MD;  Location: [facility];  Service: Orthopedics;  Laterality: Left;  Left middle finger   No Known Allergies No current facility-administered medications on file prior to encounter.   Current Outpatient Medications on File Prior to Encounter  Medication Sig Dispense Refill   rosuvastatin (CRESTOR) 5 MG tablet Take 5 mg by mouth daily.     dorzolamide-timolol (COSOPT) 22.3-6.8 MG/ML ophthalmic solution 1 drop 2 (two) times daily.     furosemide (LASIX) 20 MG tablet Take 20 mg by mouth daily.     hydrochlorothiazide (HYDRODIURIL) 12.5 MG tablet Take 12.5 mg by mouth daily.     losartan (COZAAR) 25 MG tablet Take 1 tablet (25 mg total) by mouth daily. 30 tablet 0   pravastatin (PRAVACHOL) 40 MG tablet Take 40 mg by mouth daily.     Social History   Socioeconomic History   Marital status: Married     Spouse name: Not on file   Number of children: Not on file   Years of education: Not on file   Highest education level: Not on file  Occupational History   Not on file  Tobacco Use   Smoking status: Never Smoker   Smokeless tobacco: Never Used  Substance and Sexual Activity   Alcohol use: No   Drug use: No   Sexual activity: Not on file  Other Topics Concern   Not on file  Social History Narrative   Not on file   Social Determinants of Health   Financial Resource Strain:    Difficulty of Paying Living Expenses:   Food Insecurity:    Worried About Radiation protection practitioner of Food in the Last Year:    Barista in the Last Year:   Transportation Needs:    Freight forwarder (Medical):    Lack of Transportation (Non-Medical):   Physical Activity:    Days of Exercise per Week:    Minutes of Exercise per Session:   Stress:    Feeling of Stress :   Social Connections:    Frequency of Communication with Friends and Family:    Frequency of Social Gatherings with Friends and Family:    Attends Religious Services:    Active Member of Clubs or  Organizations:    Attends Engineer, structural:    Marital Status:   Intimate Partner Violence:    Fear of Current or Ex-Partner:    Emotionally Abused:    Physically Abused:    Sexually Abused:    No family history on file.  OBJECTIVE:  Vitals:   07/05/19 1803  BP: 119/67  Pulse: 79  Resp: 18  Temp: 99.1 F (37.3 C)  TempSrc: Oral  SpO2: 96%  Weight: 130 lb (59 kg)  Height: 5\' 1"  (1.549 m)     Physical Exam Vitals and nursing note reviewed.  Constitutional:      General: She is not in acute distress.    Appearance: Normal appearance. She is normal weight. She is not ill-appearing, toxic-appearing or diaphoretic.  Cardiovascular:     Rate and Rhythm: Normal rate and regular rhythm.     Pulses: Normal pulses.     Heart sounds: Normal heart sounds. No murmur heard.  No friction rub.  No gallop.   Pulmonary:     Effort: Pulmonary effort is normal. No respiratory distress.     Breath sounds: Normal breath sounds. No stridor. No wheezing, rhonchi or rales.  Chest:     Chest wall: No tenderness.  Skin:    General: Skin is warm.     Findings: Erythema and wound present.  Neurological:     Mental Status: She is alert.     LABS:  No results found for this or any previous visit (from the past 24 hour(s)).   ASSESSMENT & PLAN:  1. Cat bite of right lower leg, initial encounter   2. Open wound of right knee, leg, and ankle with complication, initial encounter     Meds ordered this encounter  Medications   amoxicillin-clavulanate (AUGMENTIN) 875-125 MG tablet    Sig: Take 1 tablet by mouth every 12 (twelve) hours.    Dispense:  14 tablet    Refill:  0    Discharge instructions Wash with warm water and mild soap Augmentin prescribed.  Take as directed and to completion Follow up with PCP if symptoms persists Return or go to the ED if you have any new or worsening symptoms such as increasing redness, swelling, drainage, fever, chills, nausea, chest pain, SOB, etc...    Reviewed expectations re: course of current medical issues. Questions answered. Outlined signs and symptoms indicating need for more acute intervention. Patient verbalized understanding. After Visit Summary given.      Note: This document was prepared using Dragon voice recognition software and may include unintentional dictation errors.    , FNP 07/05/19 1900

## 2019-07-24 DIAGNOSIS — I10 Essential (primary) hypertension: Secondary | ICD-10-CM | POA: Diagnosis not present

## 2019-07-24 DIAGNOSIS — R7301 Impaired fasting glucose: Secondary | ICD-10-CM | POA: Diagnosis not present

## 2019-07-24 DIAGNOSIS — I700 Atherosclerosis of aorta: Secondary | ICD-10-CM | POA: Diagnosis not present

## 2019-07-24 DIAGNOSIS — E782 Mixed hyperlipidemia: Secondary | ICD-10-CM | POA: Diagnosis not present

## 2019-07-29 DIAGNOSIS — E782 Mixed hyperlipidemia: Secondary | ICD-10-CM | POA: Diagnosis not present

## 2019-07-29 DIAGNOSIS — I739 Peripheral vascular disease, unspecified: Secondary | ICD-10-CM | POA: Diagnosis not present

## 2019-07-29 DIAGNOSIS — E875 Hyperkalemia: Secondary | ICD-10-CM | POA: Diagnosis not present

## 2019-07-29 DIAGNOSIS — R7301 Impaired fasting glucose: Secondary | ICD-10-CM | POA: Diagnosis not present

## 2019-07-29 DIAGNOSIS — H40059 Ocular hypertension, unspecified eye: Secondary | ICD-10-CM | POA: Diagnosis not present

## 2019-07-29 DIAGNOSIS — I10 Essential (primary) hypertension: Secondary | ICD-10-CM | POA: Diagnosis not present

## 2019-07-29 DIAGNOSIS — Z0001 Encounter for general adult medical examination with abnormal findings: Secondary | ICD-10-CM | POA: Diagnosis not present

## 2019-07-29 DIAGNOSIS — I700 Atherosclerosis of aorta: Secondary | ICD-10-CM | POA: Diagnosis not present

## 2019-08-08 DIAGNOSIS — Z08 Encounter for follow-up examination after completed treatment for malignant neoplasm: Secondary | ICD-10-CM | POA: Diagnosis not present

## 2019-08-08 DIAGNOSIS — L728 Other follicular cysts of the skin and subcutaneous tissue: Secondary | ICD-10-CM | POA: Diagnosis not present

## 2019-08-08 DIAGNOSIS — Z85828 Personal history of other malignant neoplasm of skin: Secondary | ICD-10-CM | POA: Diagnosis not present

## 2019-08-08 DIAGNOSIS — L820 Inflamed seborrheic keratosis: Secondary | ICD-10-CM | POA: Diagnosis not present

## 2019-08-17 IMAGING — CT CT HEAD W/O CM
3 series · 16 of 47 positions shown, 19 images · non-contrast
Comparison: None.

CLINICAL DATA: Slurred speech

EXAM:
CT HEAD WITHOUT CONTRAST
TECHNIQUE: Contiguous axial images were obtained from the base of the skull
through the vertex without intravenous contrast.

[Series 2: head wo · axial · 0.40mm/px · z∈[+1461,+1586]mm · 10 of 30 slices shown, 13 images]
[im 3/30  brain]
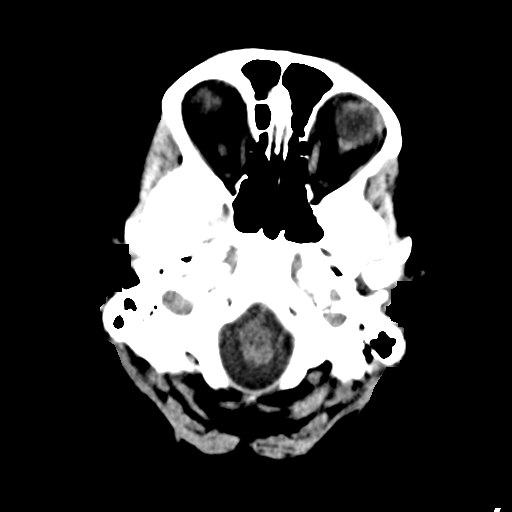
[im 3/30  bone]
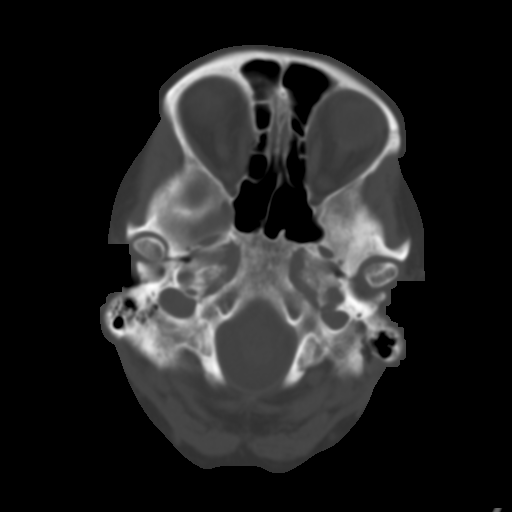
[im 6/30  brain]
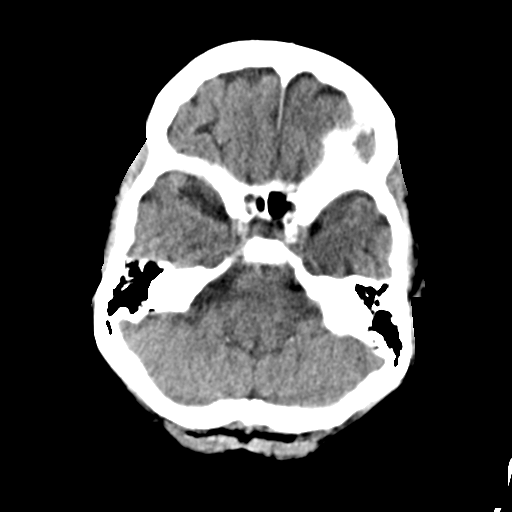
[im 9/30  brain]
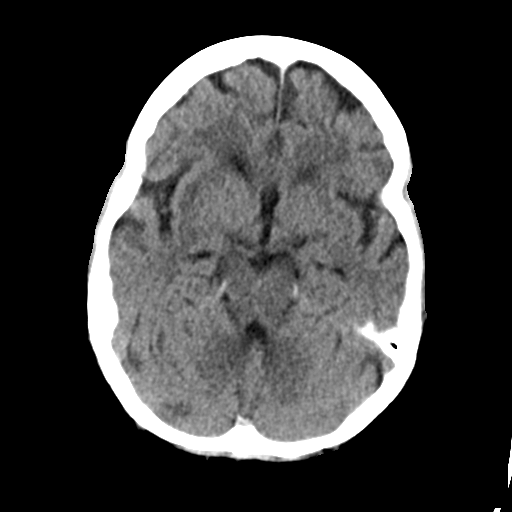
[im 11/30  brain]
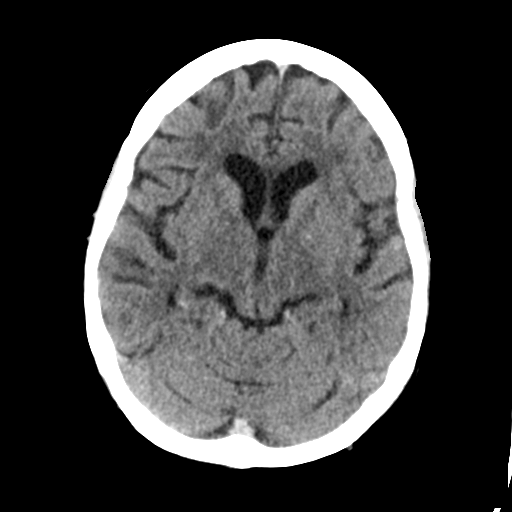
[im 14/30  brain]
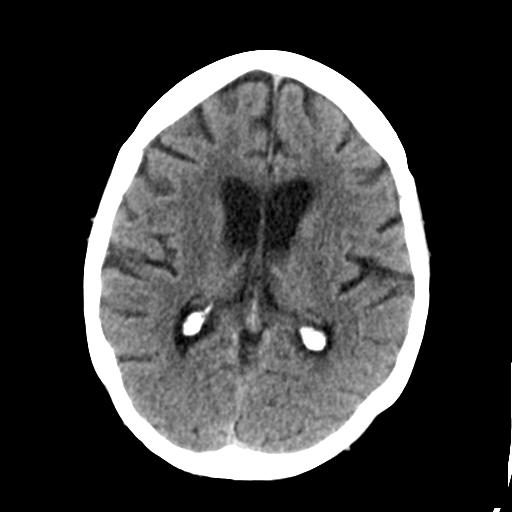
[im 14/30  bone]
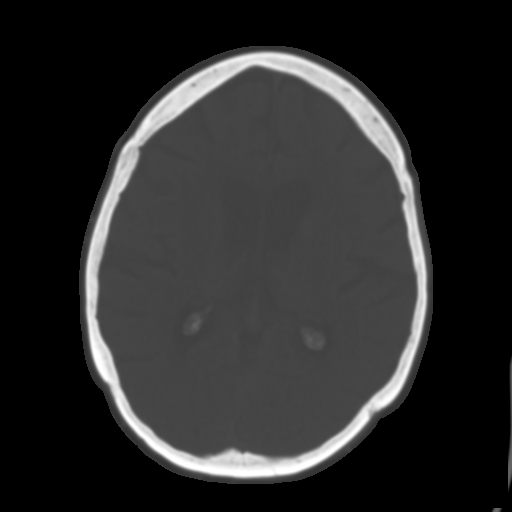
[im 17/30  brain]
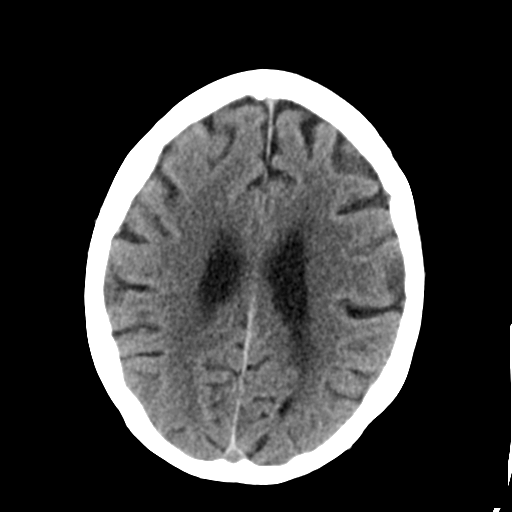
[im 20/30  brain]
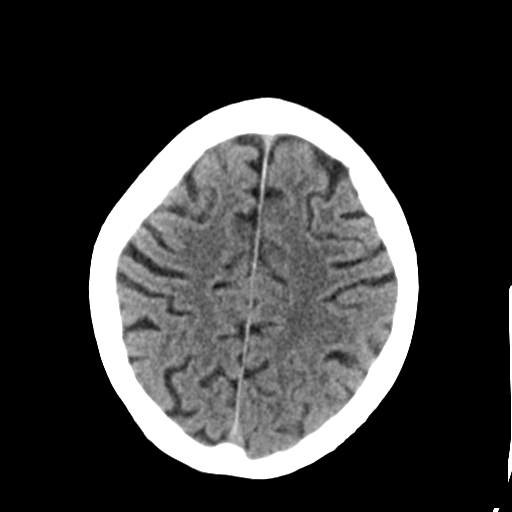
[im 23/30  brain]
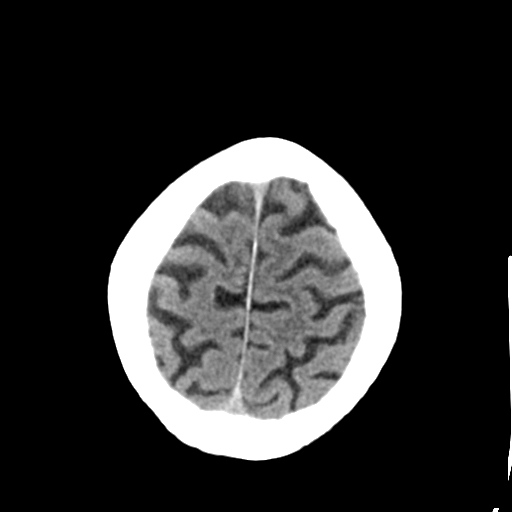
[im 25/30  brain]
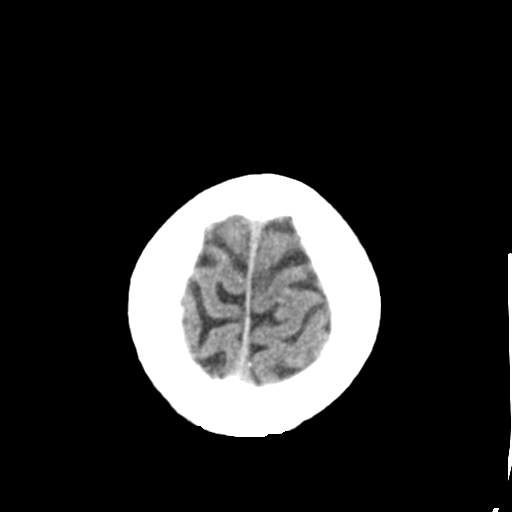
[im 25/30  bone]
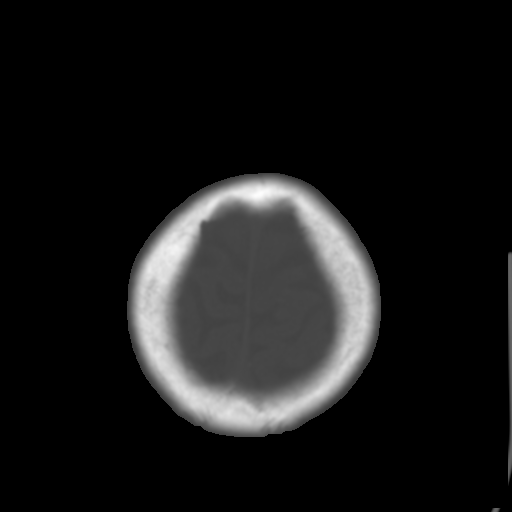
[im 28/30  brain]
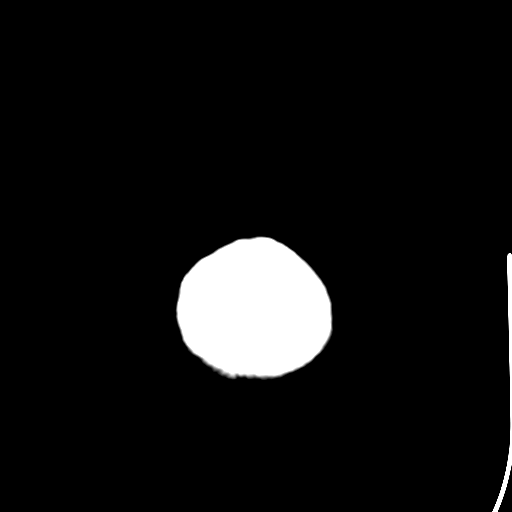

[Series 4: coronal soft tissue · coronal · 0.32mm/px · 3 of 67 slices shown]
[im 23/67  brain]
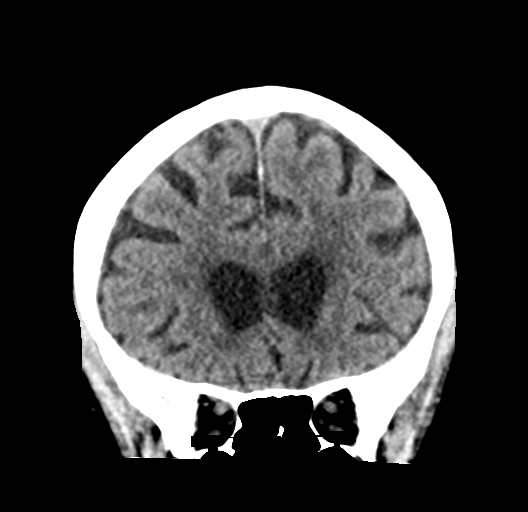
[im 30/67  brain]
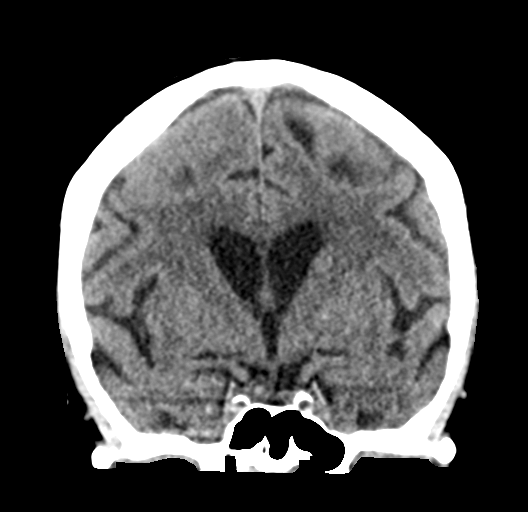
[im 37/67  brain]
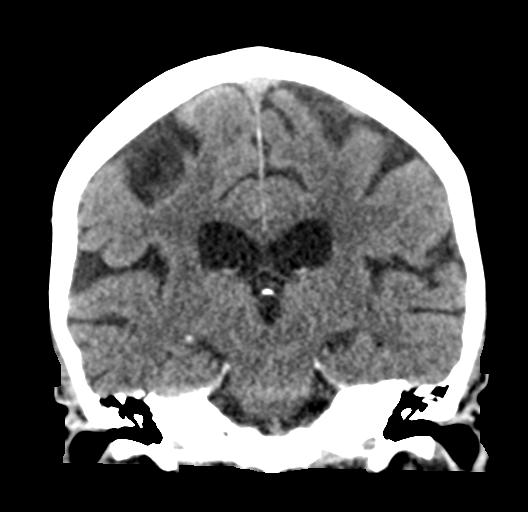

[Series 5: sagittal soft tissue · sagittal · 0.31mm/px · 3 of 52 slices shown]
[im 18/52  brain]
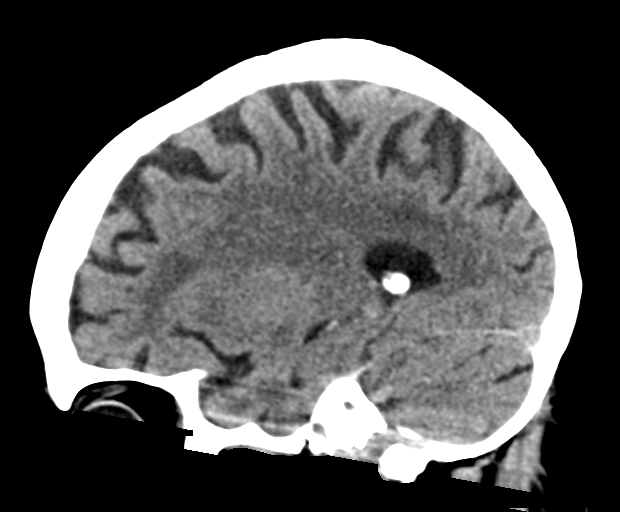
[im 26/52  brain]
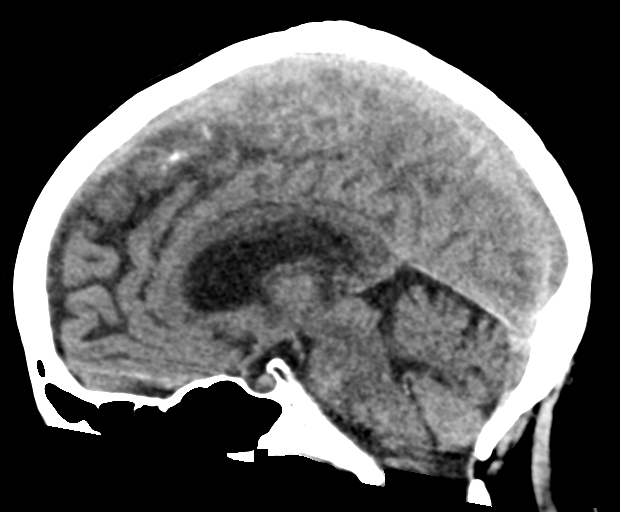
[im 35/52  brain]
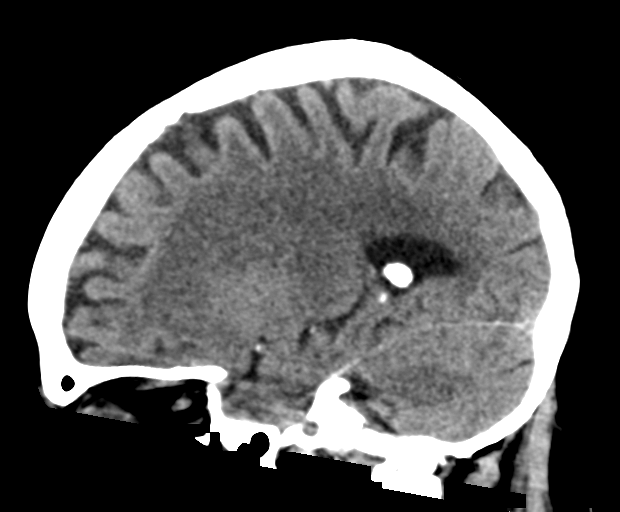

[16 of 47 positions shown; findings below may reference images not displayed]

FINDINGS: Brain: Global atrophy. Mild chronic ischemic changes in the
periventricular white matter. No mass effect, midline shift, or
acute intracranial hemorrhage.

Vascular: No hyperdense vessel or unexpected calcification.

Skull: Cranium is intact.

Sinuses/Orbits: No acute finding.

Other: None.
IMPRESSION: Global atrophy and chronic ischemic changes. No acute intracranial
pathology.

## 2019-09-20 DIAGNOSIS — H4010X1 Unspecified open-angle glaucoma, mild stage: Secondary | ICD-10-CM | POA: Diagnosis not present

## 2020-02-05 DIAGNOSIS — E782 Mixed hyperlipidemia: Secondary | ICD-10-CM | POA: Diagnosis not present

## 2020-02-05 DIAGNOSIS — R3915 Urgency of urination: Secondary | ICD-10-CM | POA: Diagnosis not present

## 2020-02-05 DIAGNOSIS — Z0000 Encounter for general adult medical examination without abnormal findings: Secondary | ICD-10-CM | POA: Diagnosis not present

## 2020-02-05 DIAGNOSIS — I739 Peripheral vascular disease, unspecified: Secondary | ICD-10-CM | POA: Diagnosis not present

## 2020-02-05 DIAGNOSIS — I6529 Occlusion and stenosis of unspecified carotid artery: Secondary | ICD-10-CM | POA: Diagnosis not present

## 2020-02-05 DIAGNOSIS — Z0001 Encounter for general adult medical examination with abnormal findings: Secondary | ICD-10-CM | POA: Diagnosis not present

## 2020-02-05 DIAGNOSIS — I10 Essential (primary) hypertension: Secondary | ICD-10-CM | POA: Diagnosis not present

## 2020-02-05 DIAGNOSIS — I2510 Atherosclerotic heart disease of native coronary artery without angina pectoris: Secondary | ICD-10-CM | POA: Diagnosis not present

## 2020-02-05 DIAGNOSIS — E875 Hyperkalemia: Secondary | ICD-10-CM | POA: Diagnosis not present

## 2020-02-06 DIAGNOSIS — H903 Sensorineural hearing loss, bilateral: Secondary | ICD-10-CM | POA: Diagnosis not present

## 2020-02-10 DIAGNOSIS — E875 Hyperkalemia: Secondary | ICD-10-CM | POA: Diagnosis not present

## 2020-02-10 DIAGNOSIS — I739 Peripheral vascular disease, unspecified: Secondary | ICD-10-CM | POA: Diagnosis not present

## 2020-02-10 DIAGNOSIS — E782 Mixed hyperlipidemia: Secondary | ICD-10-CM | POA: Diagnosis not present

## 2020-02-10 DIAGNOSIS — H40059 Ocular hypertension, unspecified eye: Secondary | ICD-10-CM | POA: Diagnosis not present

## 2020-02-10 DIAGNOSIS — R7301 Impaired fasting glucose: Secondary | ICD-10-CM | POA: Diagnosis not present

## 2020-02-10 DIAGNOSIS — I700 Atherosclerosis of aorta: Secondary | ICD-10-CM | POA: Diagnosis not present

## 2020-02-10 DIAGNOSIS — I10 Essential (primary) hypertension: Secondary | ICD-10-CM | POA: Diagnosis not present

## 2020-06-15 DIAGNOSIS — H401112 Primary open-angle glaucoma, right eye, moderate stage: Secondary | ICD-10-CM | POA: Diagnosis not present

## 2020-06-18 ENCOUNTER — Other Ambulatory Visit: Payer: Self-pay

## 2020-06-18 ENCOUNTER — Encounter (INDEPENDENT_AMBULATORY_CARE_PROVIDER_SITE_OTHER): Payer: Medicare PPO | Admitting: Ophthalmology

## 2020-06-18 DIAGNOSIS — H35033 Hypertensive retinopathy, bilateral: Secondary | ICD-10-CM | POA: Diagnosis not present

## 2020-06-18 DIAGNOSIS — H43812 Vitreous degeneration, left eye: Secondary | ICD-10-CM

## 2020-06-18 DIAGNOSIS — H353132 Nonexudative age-related macular degeneration, bilateral, intermediate dry stage: Secondary | ICD-10-CM

## 2020-06-18 DIAGNOSIS — I10 Essential (primary) hypertension: Secondary | ICD-10-CM

## 2020-08-03 DIAGNOSIS — I10 Essential (primary) hypertension: Secondary | ICD-10-CM | POA: Insufficient documentation

## 2020-08-04 DIAGNOSIS — I10 Essential (primary) hypertension: Secondary | ICD-10-CM | POA: Diagnosis not present

## 2020-08-10 DIAGNOSIS — R7301 Impaired fasting glucose: Secondary | ICD-10-CM | POA: Diagnosis not present

## 2020-08-10 DIAGNOSIS — M6281 Muscle weakness (generalized): Secondary | ICD-10-CM | POA: Diagnosis not present

## 2020-08-10 DIAGNOSIS — E875 Hyperkalemia: Secondary | ICD-10-CM | POA: Diagnosis not present

## 2020-08-10 DIAGNOSIS — H40059 Ocular hypertension, unspecified eye: Secondary | ICD-10-CM | POA: Diagnosis not present

## 2020-08-10 DIAGNOSIS — I10 Essential (primary) hypertension: Secondary | ICD-10-CM | POA: Diagnosis not present

## 2020-08-10 DIAGNOSIS — E871 Hypo-osmolality and hyponatremia: Secondary | ICD-10-CM | POA: Diagnosis not present

## 2020-08-10 DIAGNOSIS — I700 Atherosclerosis of aorta: Secondary | ICD-10-CM | POA: Diagnosis not present

## 2020-08-10 DIAGNOSIS — E782 Mixed hyperlipidemia: Secondary | ICD-10-CM | POA: Diagnosis not present

## 2020-08-26 DIAGNOSIS — B351 Tinea unguium: Secondary | ICD-10-CM | POA: Insufficient documentation

## 2020-09-22 ENCOUNTER — Other Ambulatory Visit: Payer: Self-pay

## 2020-09-22 ENCOUNTER — Ambulatory Visit (HOSPITAL_COMMUNITY): Payer: Medicare PPO | Attending: Internal Medicine | Admitting: Physical Therapy

## 2020-09-22 DIAGNOSIS — M6281 Muscle weakness (generalized): Secondary | ICD-10-CM | POA: Insufficient documentation

## 2020-09-22 NOTE — Therapy (Addendum)
[facility] [address] [place], [place], [address] Phone: [phone]   Fax:  [phone]  Physical Therapy Evaluation  Patient Details  Name: Theresa Morris MRN: [id number] Date of Birth: 1931/08/26 Referring Provider (PT): Catalina Pizza   Encounter Date: 09/22/2020   PT End of Session - 09/22/20 1126     Visit Number 1    Number of Visits 6    Date for PT Re-Evaluation 10/13/20    Authorization Type Francine Graven auth requested    Progress Note Due on Visit 6    PT Start Time 1135    PT Stop Time 1210    PT Time Calculation (min) 35 min             Past Medical History:  Diagnosis Date   Arthritis    Complication of anesthesia    was told she had a small airway   Glaucoma    Hyperlipemia    Hypertension    TIA (transient ischemic attack)    had a finger go limp-was told tia.    Past Surgical History:  Procedure Laterality Date   CESAREAN SECTION     EYE SURGERY  2009   rt eye   TONSILLECTOMY     TRIGGER FINGER RELEASE  09/27/2011   Procedure: RELEASE TRIGGER FINGER/A-1 PULLEY;  Surgeon: Nicki Reaper, MD;  Location: [facility];  Service: Orthopedics;  Laterality: Left;  Left middle finger    There were no vitals filed for this visit.    Subjective Assessment - 09/22/20 1137     Subjective Ms. Ponce states that her husband passed away recently, he had a mm condition where he would just give out and she would have to hold him up.  She was doing fine but has noticed that she has gotten weaker in her legs .  She has noticed that she is having significant difficulty with walking up inclines and steps. She states that her balance is not as good as it was at one time as well but she notices this outside on uneven ground more than inside.    Pertinent History OA, HTN , TIA    Limitations Walking;House hold activities    How long can you sit comfortably? no problem    How long can you stand comfortably?  tired after just opening up her mail    How long can you walk comfortably? tired after getting the mail or taking the garbage out.    Patient Stated Goals better balance and to be able to go up an incline, steps without difficulty                [facility] PT Assessment - 09/22/20 0001       Assessment   Medical Diagnosis B LE weakness    Referring Provider (PT) Catalina Pizza    Onset Date/Surgical Date 11/17/20    Next MD Visit not scheduled    Prior Therapy years ago      Precautions   Precautions None      Restrictions   Weight Bearing Restrictions No      Balance Screen   Has the patient fallen in the past 6 months No    Has the patient had a decrease in activity level because of a fear of falling?  No    Is the patient reluctant to leave their home because of a fear of falling?  No      Home Environment  Living Environment Private residence      Prior Function   Level of Independence Independent    Vocation Retired      IT consultant   Overall Cognitive Status Within Functional Limits for tasks assessed      Functional Tests   Functional tests Single leg stance;Sit to Stand      Single Leg Stance   Comments RT: 17"; Lt 7"      Sit to Stand   Comments 12 in 30 seconds      ROM / Strength   AROM / PROM / Strength Strength      Strength   Strength Assessment Site Hip;Knee;Ankle    Right/Left Hip Right;Left    Right Hip Flexion 5/5    Right Hip Extension 4+/5    Right Hip ABduction 5/5    Left Hip Flexion 5/5    Left Hip Extension 4+/5    Left Hip ABduction 5/5    Right/Left Knee Left;Right    Right Knee Flexion 4+/5    Right Knee Extension 5/5    Left Knee Flexion 4+/5    Left Knee Extension 5/5    Right/Left Ankle Right;Left    Right Ankle Dorsiflexion 4-/5    Left Ankle Dorsiflexion 5/5      Ambulation/Gait   Ambulation Distance (Feet) 434 Feet    Gait Comments 2 mwt                        Objective measurements completed on  examination: See above findings.       [facility] Adult PT Treatment/Exercise - 09/22/20 0001       Exercises   Exercises Knee/Hip      Knee/Hip Exercises: Standing   SLS x 5 B      Knee/Hip Exercises: Seated   Sit to Sand 10 reps      Knee/Hip Exercises: Supine   Other Supine Knee/Hip Exercises long sit green tband resisted DF                     PT Education - 09/22/20 1213     Education Details HEP    Person(s) Educated Patient    Methods Explanation    Comprehension Verbalized understanding;Returned demonstration              PT Short Term Goals - 09/22/20 1258       PT SHORT TERM GOAL #1   Title PT to be I in HEP to be able to increase her core and LE strength to be able to walk to get the mail without being tired.    Time 2    Period Weeks    Status New    Target Date 10/06/20               PT Long Term Goals - 09/22/20 1259       PT LONG TERM GOAL #1   Title PT to be I with an advance HEP to note that she is able to go up inclines without difficulty    Time 3    Status New    Target Date 10/13/20      PT LONG TERM GOAL #2   Title PT to be able to single leg stance on both LE for at least 20 seconds to reduce risk of falling    Time 3    Period Weeks    Status New      PT LONG  TERM GOAL #3   Title Pt will demo improved balance and strength evident by her ability to ascend/descend 4, 6" steps while holding no more than 5# and without noted LOB/ to replicate coming in with groceries.                    Plan - 09/22/20 1127     Clinical Impression Statement Ms. Pardy is an 87 yo female who has been referred to skilled PT for weakness of B LE.  Evaluation demonsrtates decreased activity tolerance, decreased strength and decreased balance, especially on uneven terrain.  Ms. Timmothy Sours will benefit from skilled PT to address these issues and reduce her risk of falling as well as increasing her functional ability.    Personal  Factors and Comorbidities Age;Time since onset of injury/illness/exacerbation    Examination-Activity Limitations Locomotion Level;Stairs;Stand    Examination-Participation Restrictions Community Activity;Yard Work    Stability/Clinical Decision Making Stable/Uncomplicated    Optometrist Low    Rehab Potential Good    PT Frequency 2x / week    PT Duration 3 weeks    PT Treatment/Interventions Patient/family education;Therapeutic exercise;Therapeutic activities;Balance training;Stair training    PT Next Visit Plan balance activity on foam, toe raises. balance board, progress dynamic balance and strength    PT Home Exercise Plan eval: sit to stand, single leg stance resisted ankle dorsiflexion             Patient will benefit from skilled therapeutic intervention in order to improve the following deficits and impairments:  Decreased strength, Decreased balance  Visit Diagnosis: Muscle weakness (generalized)     Problem List Patient Active Problem List   Diagnosis Date Noted   PVD (peripheral vascular disease) (HCC) 11/05/2013    Virgina Organ, PT CLT [id number] , PT 09/22/2020, 1:02 PM  [facility] [address] [place], [place], [address] Phone: [phone]   Fax:  [phone]  Name: DAFNA ROMO MRN: [id number] Date of Birth: Nov 17, 1931

## 2020-09-24 ENCOUNTER — Other Ambulatory Visit: Payer: Self-pay

## 2020-09-24 ENCOUNTER — Ambulatory Visit (HOSPITAL_COMMUNITY): Payer: Medicare PPO | Admitting: Physical Therapy

## 2020-09-24 DIAGNOSIS — M6281 Muscle weakness (generalized): Secondary | ICD-10-CM | POA: Diagnosis not present

## 2020-09-24 NOTE — Therapy (Signed)
[facility] [address] [place], [place], [address] Phone: [phone]   Fax:  [phone]  Physical Therapy Treatment  Patient Details  Name: Theresa Morris MRN: [id number] Date of Birth: [birth year] Referring Provider (PT): Catalina Pizza   Encounter Date: 09/24/2020   PT End of Session - 09/24/20 1316     Visit Number 2    Number of Visits 6    Date for PT Re-Evaluation 10/13/20    Authorization Type [employer] aut requested    Progress Note Due on Visit 6    PT Start Time 1318    PT Stop Time 1400    PT Time Calculation (min) 42 min             Past Medical History:  Diagnosis Date   Arthritis    Complication of anesthesia    was told she had a small airway   Glaucoma    Hyperlipemia    Hypertension    TIA (transient ischemic attack)    had a finger go limp-was told tia.    Past Surgical History:  Procedure Laterality Date   CESAREAN SECTION     EYE SURGERY  2009   rt eye   TONSILLECTOMY     TRIGGER FINGER RELEASE  09/27/2011   Procedure: RELEASE TRIGGER FINGER/A-1 PULLEY;  Surgeon: Nicki Reaper, MD;  Location: [facility];  Service: Orthopedics;  Laterality: Left;  Left middle finger    There were no vitals filed for this visit.   Subjective Assessment - 09/24/20 1312     Subjective Pt has been doing her HEP and already feels stronger.    Pertinent History OA, HTN , TIA    Limitations Walking;House hold activities    How long can you sit comfortably? no problem    How long can you stand comfortably? tired after just opening up her mail    How long can you walk comfortably? tired after getting the mail or taking the garbage out.    Patient Stated Goals better balance and to be able to go up an incline, steps without difficulty                               [facility] Adult PT Treatment/Exercise - 09/24/20 0001       Ambulation/Gait   Ambulation Distance (Feet) --    Gait  Comments --      Exercises   Exercises Knee/Hip      Knee/Hip Exercises: Machines for Strengthening   Total Gym Leg Press 3 Pl x 10      Knee/Hip Exercises: Standing   Heel Raises Both;15 reps    Rocker Board 2 minutes    SLS x 5 B      Knee/Hip Exercises: Seated   Sit to Sand 10 reps      Knee/Hip Exercises: Supine   Other Supine Knee/Hip Exercises long sit green tband resisted DF                 Balance Exercises - 09/24/20 0001       Balance Exercises: Standing   Tandem Stance Foam/compliant surface;3 reps   head turns   SLS Eyes open;Solid surface;Eyes closed    SLS with Vectors 3 reps   5 seconds   Rockerboard Lateral;Other time (comment)   2'   Marching 10 reps    Heel Raises 10 reps  Sit to Stand Standard surface   10   Other Standing Exercises Paloff red x 10 B                  PT Short Term Goals - 09/24/20 1331       PT SHORT TERM GOAL #1   Title PT to be I in HEP to be able to increase her core and LE strength to be able to walk to get the mail without being tired.    Time 2    Period Weeks    Status On-going    Target Date 10/06/20               PT Long Term Goals - 09/24/20 1331       PT LONG TERM GOAL #1   Title PT to be I with an advance HEP to note that she is able to go up inclines without difficulty    Time 3    Status On-going      PT LONG TERM GOAL #2   Title PT to be able to single leg stance on both LE for at least 20 seconds to reduce risk of falling    Time 3    Period Weeks    Status On-going      PT LONG TERM GOAL #3   Title Pt will demo improved balance and strength evident by her ability to ascend/descend 4, 6" steps while holding no more than 5# and without noted LOB/ to replicate coming in with groceries.                   Plan - 09/24/20 1359     Clinical Impression Statement Treatment focused on both strength of hips and balance.  Pt able to complete all exercises with good technique  when cued properly.  Pt will continue to benefit from skilled PT to improve core strength and balance to reduce risk of falling    Personal Factors and Comorbidities Age;Time since onset of injury/illness/exacerbation    Examination-Activity Limitations Locomotion Level;Stairs;Stand    Examination-Participation Restrictions Community Activity;Yard Work    Stability/Clinical Decision Making Stable/Uncomplicated    Rehab Potential Good    PT Frequency 2x / week    PT Duration 3 weeks    PT Treatment/Interventions Patient/family education;Therapeutic exercise;Therapeutic activities;Balance training;Stair training    PT Next Visit Plan balance activity on foam, toe raises. balance board, progress dynamic balance and strength    PT Home Exercise Plan eval: sit to stand, single leg stance resisted ankle dorsiflexion             Patient will benefit from skilled therapeutic intervention in order to improve the following deficits and impairments:  Decreased strength, Decreased balance  Visit Diagnosis: Muscle weakness (generalized)     Problem List Patient Active Problem List   Diagnosis Date Noted   PVD (peripheral vascular disease) (HCC) 11/05/2013    Virgina Organ, PT CLT [phone] , PT 09/24/2020, 2:01 PM  [facility] [address] [place], [place], [address] Phone: [phone]   Fax:  [phone]  Name: Theresa Morris MRN: [id number] Date of Birth: [birth year]

## 2020-09-28 ENCOUNTER — Ambulatory Visit (HOSPITAL_COMMUNITY): Payer: Medicare PPO | Admitting: Physical Therapy

## 2020-09-30 ENCOUNTER — Other Ambulatory Visit: Payer: Self-pay

## 2020-09-30 ENCOUNTER — Ambulatory Visit (HOSPITAL_COMMUNITY): Payer: Medicare PPO | Admitting: Physical Therapy

## 2020-09-30 DIAGNOSIS — M6281 Muscle weakness (generalized): Secondary | ICD-10-CM

## 2020-09-30 NOTE — Therapy (Signed)
[facility] [address] [place], [place], [address] Phone: [phone]   Fax:  [phone]  Physical Therapy Treatment  Patient Details  Name: Theresa Morris MRN: [id number] Date of Birth: 1931-08-31 Referring Provider (PT): Catalina Pizza   Encounter Date: 09/30/2020   PT End of Session - 09/30/20 1631     Visit Number 3    Number of Visits 6    Date for PT Re-Evaluation 10/13/20    Authorization Type [employer] aut requested    Progress Note Due on Visit 6    PT Start Time 1450    PT Stop Time 1534    PT Time Calculation (min) 44 min             Past Medical History:  Diagnosis Date   Arthritis    Complication of anesthesia    was told she had a small airway   Glaucoma    Hyperlipemia    Hypertension    TIA (transient ischemic attack)    had a finger go limp-was told tia.    Past Surgical History:  Procedure Laterality Date   CESAREAN SECTION     EYE SURGERY  2009   rt eye   TONSILLECTOMY     TRIGGER FINGER RELEASE  09/27/2011   Procedure: RELEASE TRIGGER FINGER/A-1 PULLEY;  Surgeon: Nicki Reaper, MD;  Location: [facility];  Service: Orthopedics;  Laterality: Left;  Left middle finger    There were no vitals filed for this visit.   Subjective Assessment - 09/30/20 1457     Subjective Pt reports no pain or issues.  Doing her HEP.                               [facility] Adult PT Treatment/Exercise - 09/30/20 0001       Knee/Hip Exercises: Machines for Strengthening   Total Gym Leg Press 3 Pl  2X 10      Knee/Hip Exercises: Standing   Heel Raises Both;15 reps    Hip Flexion Both;2 sets;10 reps    Hip Flexion Limitations marching no UE    Forward Lunges Both;10 reps;Limitations    Forward Lunges Limitations no UE's onto 4"    Hip Abduction Both;2 sets;10 reps    SLS x 5 B    SLS with Vectors 5X5" with 1 UE      Knee/Hip Exercises: Seated   Sit to Sand 10 reps;without UE  support                 Balance Exercises - 09/30/20 0001       Balance Exercises: Standing   Tandem Stance Foam/compliant surface;2 reps;30 secs;Eyes open                  PT Short Term Goals - 09/24/20 1331       PT SHORT TERM GOAL #1   Title PT to be I in HEP to be able to increase her core and LE strength to be able to walk to get the mail without being tired.    Time 2    Period Weeks    Status On-going    Target Date 10/06/20               PT Long Term Goals - 09/24/20 1331       PT LONG TERM GOAL #1   Title PT to be I  with an advance HEP to note that she is able to go up inclines without difficulty    Time 3    Status On-going      PT LONG TERM GOAL #2   Title PT to be able to single leg stance on both LE for at least 20 seconds to reduce risk of falling    Time 3    Period Weeks    Status On-going      PT LONG TERM GOAL #3   Title Pt will demo improved balance and strength evident by her ability to ascend/descend 4, 6" steps while holding no more than 5# and without noted LOB/ to replicate coming in with groceries.                   Plan - 09/30/20 1631     Clinical Impression Statement continued with focus on improving LE strength and balance.  Pt requires cues to maintain correct form and posturing with exercises.  Challenged tandem stance with use of foam and intermittent HHA. Able to increase most exercises to 2 sets.  Cues to keep band in front of body with palloff and maintain core stability.  No rest breaks needed during session today.    Personal Factors and Comorbidities Age;Time since onset of injury/illness/exacerbation    Examination-Activity Limitations Locomotion Level;Stairs;Stand    Examination-Participation Restrictions Community Activity;Yard Work    Stability/Clinical Decision Making Stable/Uncomplicated    Rehab Potential Good    PT Frequency 2x / week    PT Duration 3 weeks    PT Treatment/Interventions  Patient/family education;Therapeutic exercise;Therapeutic activities;Balance training;Stair training    PT Next Visit Plan balance activity on foam, toe raises. balance board, progress dynamic balance and strength    PT Home Exercise Plan eval: sit to stand, single leg stance resisted ankle dorsiflexion             Patient will benefit from skilled therapeutic intervention in order to improve the following deficits and impairments:  Decreased strength, Decreased balance  Visit Diagnosis: Muscle weakness (generalized)     Problem List Patient Active Problem List   Diagnosis Date Noted   PVD (peripheral vascular disease) (HCC) 11/05/2013   Theresa Morris, PTA/CLT [phone]  Theresa Morris, PTA 09/30/2020, 4:34 PM  [facility] [address] [place], [place], [address] Phone: [phone]   Fax:  [phone]  Name: Theresa Morris MRN: [id number] Date of Birth: 1931-03-03

## 2020-10-06 ENCOUNTER — Encounter (HOSPITAL_COMMUNITY): Payer: Self-pay

## 2020-10-06 ENCOUNTER — Other Ambulatory Visit: Payer: Self-pay

## 2020-10-06 ENCOUNTER — Ambulatory Visit (HOSPITAL_COMMUNITY): Payer: Medicare PPO | Attending: Internal Medicine

## 2020-10-06 DIAGNOSIS — M6281 Muscle weakness (generalized): Secondary | ICD-10-CM | POA: Diagnosis not present

## 2020-10-06 NOTE — Therapy (Signed)
[facility] [address] [place], [place], [address] Phone: [phone]   Fax:  [phone]  Physical Therapy Treatment  Patient Details  Name: Theresa Morris MRN: [id number] Date of Birth: [birth year] Referring Provider (PT): Catalina Pizza   Encounter Date: 10/06/2020   PT End of Session - 10/06/20 1100     Visit Number 4    Number of Visits 6    Date for PT Re-Evaluation 10/13/20    Authorization Type [employer] approved 6 visits    Authorization Time Period 09/20-->10/16/20    Authorization - Visit Number 4    Authorization - Number of Visits 6    Progress Note Due on Visit 6    PT Start Time 1053   late arrival   PT Stop Time 1132    PT Time Calculation (min) 39 min             Past Medical History:  Diagnosis Date   Arthritis    Complication of anesthesia    was told she had a small airway   Glaucoma    Hyperlipemia    Hypertension    TIA (transient ischemic attack)    had a finger go limp-was told tia.    Past Surgical History:  Procedure Laterality Date   CESAREAN SECTION     EYE SURGERY  2009   rt eye   TONSILLECTOMY     TRIGGER FINGER RELEASE  09/27/2011   Procedure: RELEASE TRIGGER FINGER/A-1 PULLEY;  Surgeon: Nicki Reaper, MD;  Location: [facility];  Service: Orthopedics;  Laterality: Left;  Left middle finger    There were no vitals filed for this visit.   Subjective Assessment - 10/06/20 1059     Subjective Pt stated she is sore, has been picking up branches from driveway.    Pertinent History OA, HTN , TIA    Patient Stated Goals better balance and to be able to go up an incline, steps without difficulty    Currently in Pain? No/denies                               [facility] Adult PT Treatment/Exercise - 10/06/20 0001       Exercises   Exercises Knee/Hip      Knee/Hip Exercises: Aerobic   Tread Mill 1. 5% incline slope      Knee/Hip Exercises:  Machines for Strengthening   Total Gym Leg Press 3 Pl  2X 10      Knee/Hip Exercises: Standing   Heel Raises Both;15 reps    Heel Raises Limitations toe raises 15x slant slope    Hip Flexion 15 reps    Hip Flexion Limitations toe tapping alternating onto 6in step no HHA    Functional Squat 10 reps    Functional Squat Limitations front of chair, cueing for mechanics    SLS x 5 B    SLS with Vectors 5X5" with 1 UE    Other Standing Knee Exercises Tandem stance on foam 1x 30"; head turns in tandem stance intermittent HHA    Other Standing Knee Exercises sidestep with RTB                       PT Short Term Goals - 09/24/20 1331       PT SHORT TERM GOAL #1   Title PT to be I in HEP to  be able to increase her core and LE strength to be able to walk to get the mail without being tired.    Time 2    Period Weeks    Status On-going    Target Date 10/06/20               PT Long Term Goals - 09/24/20 1331       PT LONG TERM GOAL #1   Title PT to be I with an advance HEP to note that she is able to go up inclines without difficulty    Time 3    Status On-going      PT LONG TERM GOAL #2   Title PT to be able to single leg stance on both LE for at least 20 seconds to reduce risk of falling    Time 3    Period Weeks    Status On-going      PT LONG TERM GOAL #3   Title Pt will demo improved balance and strength evident by her ability to ascend/descend 4, 6" steps while holding no more than 5# and without noted LOB/ to replicate coming in with groceries.                   Plan - 10/06/20 1419     Clinical Impression Statement Added squats, sidestep with resistance, and dynamic balance activities with some challenge required HHA.  Pt able to complete all exercises wiht good mechanics following initial demonstration and cueing for form.  Added incline slope on treadmill to address difficulty walking in driveway, no LOB and good gait mechanics though pt was  limited by SOB following 4 minute walking.    Personal Factors and Comorbidities Age;Time since onset of injury/illness/exacerbation    Examination-Activity Limitations Locomotion Level;Stairs;Stand    Examination-Participation Restrictions Community Activity;Yard Work    Stability/Clinical Decision Making Stable/Uncomplicated    Optometrist Low    Rehab Potential Good    PT Frequency 2x / week    PT Duration 3 weeks    PT Treatment/Interventions Patient/family education;Therapeutic exercise;Therapeutic activities;Balance training;Stair training    PT Next Visit Plan balance activity on foam, toe raises. balance board, progress dynamic balance and strength    PT Home Exercise Plan eval: sit to stand, single leg stance resisted ankle dorsiflexion             Patient will benefit from skilled therapeutic intervention in order to improve the following deficits and impairments:  Decreased strength, Decreased balance  Visit Diagnosis: Muscle weakness (generalized)     Problem List Patient Active Problem List   Diagnosis Date Noted   PVD (peripheral vascular disease) (HCC) 11/05/2013   Becky Sax, LPTA/CLT; CBIS [phone]  Juel Burrow, PTA 10/06/2020, 2:23 PM  [facility] [address] [place], [place], [address] Phone: [phone]   Fax:  [phone]  Name: Theresa Morris MRN: [id number] Date of Birth: September 13, 1931

## 2020-10-09 ENCOUNTER — Other Ambulatory Visit: Payer: Self-pay

## 2020-10-09 ENCOUNTER — Encounter (HOSPITAL_COMMUNITY): Payer: Self-pay | Admitting: Physical Therapy

## 2020-10-09 ENCOUNTER — Ambulatory Visit (HOSPITAL_COMMUNITY): Payer: Medicare PPO | Admitting: Physical Therapy

## 2020-10-09 DIAGNOSIS — M6281 Muscle weakness (generalized): Secondary | ICD-10-CM
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
## 2020-10-12 ENCOUNTER — Ambulatory Visit (HOSPITAL_COMMUNITY): Payer: Medicare PPO | Admitting: Physical Therapy

## 2020-10-12 ENCOUNTER — Encounter (HOSPITAL_COMMUNITY): Payer: Self-pay | Admitting: Physical Therapy

## 2020-10-12 ENCOUNTER — Other Ambulatory Visit: Payer: Self-pay

## 2020-10-12 DIAGNOSIS — M6281 Muscle weakness (generalized): Secondary | ICD-10-CM | POA: Diagnosis not present

## 2020-10-12 NOTE — Therapy (Signed)
[place], [place], [address] Phone: [phone]   Fax:  [phone]  Physical Therapy Treatment  Patient Details  Name: Theresa Morris MRN: [id number] Date of Birth: 11/24/1931 Referring Provider (PT): Delphina Cahill   Encounter Date: 10/12/2020   PT End of Session - 10/12/20 1301     Visit Number 6    Number of Visits 6    Date for PT Re-Evaluation 10/13/20    Authorization Type [employer] approved 6 visits    Authorization Time Period 09/20-->10/16/20    Authorization - Visit Number 6    Authorization - Number of Visits 6    Progress Note Due on Visit 6    PT Start Time 6568    PT Stop Time 1202    PT Time Calculation (min) 31 min             Past Medical History:  Diagnosis Date   Arthritis    Complication of anesthesia    was told she had a small airway   Glaucoma    Hyperlipemia    Hypertension    TIA (transient ischemic attack)    had a finger go limp-was told tia.    Past Surgical History:  Procedure Laterality Date   CESAREAN SECTION     EYE SURGERY  2009   rt eye   TONSILLECTOMY     TRIGGER FINGER RELEASE  09/27/2011   Procedure: RELEASE TRIGGER FINGER/A-1 PULLEY;  Surgeon: Wynonia Sours, MD;  Location: [facility];  Service: Orthopedics;  Laterality: Left;  Left middle finger    There were no vitals filed for this visit.   Subjective Assessment - 10/12/20 1133     Subjective States [place] and [place] around and went up the hill/incline there and though she was tired she was able to do it just fine. Reports she feels 100% better    Pertinent History OA, HTN , TIA    Patient Stated Goals better balance and to be able to go up an incline, steps without difficulty    Currently in Pain? No/denies                [facility] PT Assessment - 10/12/20 0001       Assessment   Medical Diagnosis B LE weakness    Referring Provider (PT) Delphina Cahill       Single Leg Stance   Comments 20 seconds each foot barefoot      Ambulation/Gait   Stairs Yes    Stairs Assistance 7: Independent    Stair Management Technique One rail Right;Alternating pattern    Number of Stairs 4    Height of Stairs 7    Gait Comments 5# dumbbell                           [facility] Adult PT Treatment/Exercise - 10/12/20 0001       Knee/Hip Exercises: Standing   Other Standing Knee Exercises lateral stepping in mini squat 3x10 B; table plank tactile cues 3x5 10" holds    Other Standing Knee Exercises chair pose 2x5 10" holds UE support                     PT Education - 10/12/20 1301     Education Details on current presentation, on HEP    Person(s) Educated Patient    Methods Explanation  Comprehension Verbalized understanding              PT Short Term Goals - 10/09/20 1136       PT SHORT TERM GOAL #1   Title PT to be I in HEP to be able to increase her core and LE strength to be able to walk to get the mail without being tired.    Baseline reports daily exercises and able to walk to mailbox    Time 2    Period Weeks    Status Achieved    Target Date 10/06/20               PT Long Term Goals - 10/12/20 1133       PT LONG TERM GOAL #1   Title PT to be I with an advance HEP to note that she is able to go up inclines without difficulty    Baseline performed in community    Time 3    Status Achieved      PT LONG TERM GOAL #2   Title PT to be able to single leg stance on both LE for at least 20 seconds to reduce risk of falling    Baseline multiple attempts but able to perform bilaterally without shoes on    Time 3    Period Weeks    Status Achieved      PT LONG TERM GOAL #3   Title Pt will demo improved balance and strength evident by her ability to ascend/descend 4, 6" steps while holding no more than 5# and without noted LOB/ to replicate coming in with groceries.    Baseline able to perform with one  hand hold    Status Achieved                   Plan - 10/12/20 1303     Clinical Impression Statement All goals met at this time. Reviewed HEP and answered all questions. Added to HEP and patient able to perform exercises with good form. Patient to discharge from PT to HEP.    Personal Factors and Comorbidities Age;Time since onset of injury/illness/exacerbation    Examination-Activity Limitations Locomotion Level;Stairs;Stand    Examination-Participation Restrictions Community Activity;Yard Work    Stability/Clinical Decision Making Stable/Uncomplicated    Rehab Potential Good    PT Frequency 2x / week    PT Duration 3 weeks    PT Treatment/Interventions Patient/family education;Therapeutic exercise;Therapeutic activities;Balance training;Stair training    PT Next Visit Plan DC to HEP    PT Home Exercise Plan eval: sit to stand, single leg stance resisted ankle dorsiflexion; heel raise, hip extension, hip abd, tandem stance; chair pose, lateral stepping, plank at table             Patient will benefit from skilled therapeutic intervention in order to improve the following deficits and impairments:  Decreased strength, Decreased balance  Visit Diagnosis: Muscle weakness (generalized)     Problem List Patient Active Problem List   Diagnosis Date Noted   PVD (peripheral vascular disease) ([place]) 11/05/2013   1:04 PM, 10/12/20 Jerene Pitch, DPT Physical Therapy with [facility]  [phone] office   [place] [address] [place], [place], [address] Phone: [phone]   Fax:  [phone]  Name: Theresa Morris MRN: [id number] Date of Birth: May 07, 1931

## 2020-10-15 ENCOUNTER — Encounter (HOSPITAL_COMMUNITY): Payer: Medicare PPO | Admitting: Physical Therapy

## 2020-10-20 DIAGNOSIS — H409 Unspecified glaucoma: Secondary | ICD-10-CM | POA: Diagnosis not present

## 2020-10-20 DIAGNOSIS — E785 Hyperlipidemia, unspecified: Secondary | ICD-10-CM | POA: Diagnosis not present

## 2020-10-20 DIAGNOSIS — I10 Essential (primary) hypertension: Secondary | ICD-10-CM | POA: Diagnosis not present

## 2020-10-20 DIAGNOSIS — R32 Unspecified urinary incontinence: Secondary | ICD-10-CM | POA: Diagnosis not present

## 2020-10-20 DIAGNOSIS — Z7982 Long term (current) use of aspirin: Secondary | ICD-10-CM | POA: Diagnosis not present

## 2020-11-30 DIAGNOSIS — J069 Acute upper respiratory infection, unspecified: Secondary | ICD-10-CM | POA: Diagnosis not present

## 2020-12-17 DIAGNOSIS — H04123 Dry eye syndrome of bilateral lacrimal glands: Secondary | ICD-10-CM | POA: Diagnosis not present

## 2021-02-05 DIAGNOSIS — E782 Mixed hyperlipidemia: Secondary | ICD-10-CM | POA: Diagnosis not present

## 2021-02-05 DIAGNOSIS — R7301 Impaired fasting glucose: Secondary | ICD-10-CM | POA: Diagnosis not present

## 2021-02-10 DIAGNOSIS — H40059 Ocular hypertension, unspecified eye: Secondary | ICD-10-CM | POA: Diagnosis not present

## 2021-02-10 DIAGNOSIS — E871 Hypo-osmolality and hyponatremia: Secondary | ICD-10-CM | POA: Insufficient documentation

## 2021-02-10 DIAGNOSIS — E782 Mixed hyperlipidemia: Secondary | ICD-10-CM | POA: Diagnosis not present

## 2021-02-10 DIAGNOSIS — E875 Hyperkalemia: Secondary | ICD-10-CM | POA: Diagnosis not present

## 2021-02-10 DIAGNOSIS — I10 Essential (primary) hypertension: Secondary | ICD-10-CM | POA: Diagnosis not present

## 2021-02-10 DIAGNOSIS — I700 Atherosclerosis of aorta: Secondary | ICD-10-CM | POA: Diagnosis not present

## 2021-02-10 DIAGNOSIS — R7301 Impaired fasting glucose: Secondary | ICD-10-CM | POA: Diagnosis not present

## 2021-05-10 DIAGNOSIS — M9902 Segmental and somatic dysfunction of thoracic region: Secondary | ICD-10-CM | POA: Diagnosis not present

## 2021-05-10 DIAGNOSIS — M5442 Lumbago with sciatica, left side: Secondary | ICD-10-CM | POA: Diagnosis not present

## 2021-05-10 DIAGNOSIS — M9903 Segmental and somatic dysfunction of lumbar region: Secondary | ICD-10-CM | POA: Diagnosis not present

## 2021-05-10 DIAGNOSIS — M9905 Segmental and somatic dysfunction of pelvic region: Secondary | ICD-10-CM | POA: Diagnosis not present

## 2021-05-14 DIAGNOSIS — M5442 Lumbago with sciatica, left side: Secondary | ICD-10-CM | POA: Diagnosis not present

## 2021-05-14 DIAGNOSIS — M9903 Segmental and somatic dysfunction of lumbar region: Secondary | ICD-10-CM | POA: Diagnosis not present

## 2021-05-14 DIAGNOSIS — M9902 Segmental and somatic dysfunction of thoracic region: Secondary | ICD-10-CM | POA: Diagnosis not present

## 2021-05-14 DIAGNOSIS — M9905 Segmental and somatic dysfunction of pelvic region: Secondary | ICD-10-CM | POA: Diagnosis not present

## 2021-05-17 DIAGNOSIS — M5442 Lumbago with sciatica, left side: Secondary | ICD-10-CM | POA: Diagnosis not present

## 2021-05-17 DIAGNOSIS — M9902 Segmental and somatic dysfunction of thoracic region: Secondary | ICD-10-CM | POA: Diagnosis not present

## 2021-05-17 DIAGNOSIS — M9905 Segmental and somatic dysfunction of pelvic region: Secondary | ICD-10-CM | POA: Diagnosis not present

## 2021-05-17 DIAGNOSIS — M9903 Segmental and somatic dysfunction of lumbar region: Secondary | ICD-10-CM | POA: Diagnosis not present

## 2021-05-21 DIAGNOSIS — M9903 Segmental and somatic dysfunction of lumbar region: Secondary | ICD-10-CM | POA: Diagnosis not present

## 2021-05-21 DIAGNOSIS — M5442 Lumbago with sciatica, left side: Secondary | ICD-10-CM | POA: Diagnosis not present

## 2021-05-21 DIAGNOSIS — M9902 Segmental and somatic dysfunction of thoracic region: Secondary | ICD-10-CM | POA: Diagnosis not present

## 2021-05-21 DIAGNOSIS — M9905 Segmental and somatic dysfunction of pelvic region: Secondary | ICD-10-CM | POA: Diagnosis not present

## 2021-05-24 DIAGNOSIS — M9905 Segmental and somatic dysfunction of pelvic region: Secondary | ICD-10-CM | POA: Diagnosis not present

## 2021-05-24 DIAGNOSIS — M9903 Segmental and somatic dysfunction of lumbar region: Secondary | ICD-10-CM | POA: Diagnosis not present

## 2021-05-24 DIAGNOSIS — M9902 Segmental and somatic dysfunction of thoracic region: Secondary | ICD-10-CM | POA: Diagnosis not present

## 2021-05-24 DIAGNOSIS — M5442 Lumbago with sciatica, left side: Secondary | ICD-10-CM | POA: Diagnosis not present

## 2021-05-28 DIAGNOSIS — M5442 Lumbago with sciatica, left side: Secondary | ICD-10-CM | POA: Diagnosis not present

## 2021-05-28 DIAGNOSIS — M9903 Segmental and somatic dysfunction of lumbar region: Secondary | ICD-10-CM | POA: Diagnosis not present

## 2021-05-28 DIAGNOSIS — M9902 Segmental and somatic dysfunction of thoracic region: Secondary | ICD-10-CM | POA: Diagnosis not present

## 2021-05-28 DIAGNOSIS — M9905 Segmental and somatic dysfunction of pelvic region: Secondary | ICD-10-CM | POA: Diagnosis not present

## 2021-06-09 DIAGNOSIS — H401131 Primary open-angle glaucoma, bilateral, mild stage: Secondary | ICD-10-CM | POA: Diagnosis not present

## 2021-06-11 ENCOUNTER — Encounter (INDEPENDENT_AMBULATORY_CARE_PROVIDER_SITE_OTHER): Payer: Medicare PPO | Admitting: Ophthalmology

## 2021-06-11 DIAGNOSIS — I10 Essential (primary) hypertension: Secondary | ICD-10-CM | POA: Diagnosis not present

## 2021-06-11 DIAGNOSIS — H353132 Nonexudative age-related macular degeneration, bilateral, intermediate dry stage: Secondary | ICD-10-CM | POA: Diagnosis not present

## 2021-06-11 DIAGNOSIS — H35033 Hypertensive retinopathy, bilateral: Secondary | ICD-10-CM

## 2021-06-11 DIAGNOSIS — M9902 Segmental and somatic dysfunction of thoracic region: Secondary | ICD-10-CM | POA: Diagnosis not present

## 2021-06-11 DIAGNOSIS — H43812 Vitreous degeneration, left eye: Secondary | ICD-10-CM

## 2021-06-11 DIAGNOSIS — M9903 Segmental and somatic dysfunction of lumbar region: Secondary | ICD-10-CM | POA: Diagnosis not present

## 2021-06-11 DIAGNOSIS — M5442 Lumbago with sciatica, left side: Secondary | ICD-10-CM | POA: Diagnosis not present

## 2021-06-11 DIAGNOSIS — M9905 Segmental and somatic dysfunction of pelvic region: Secondary | ICD-10-CM | POA: Diagnosis not present

## 2021-08-04 DIAGNOSIS — E782 Mixed hyperlipidemia: Secondary | ICD-10-CM | POA: Diagnosis not present

## 2021-08-04 DIAGNOSIS — R7301 Impaired fasting glucose: Secondary | ICD-10-CM | POA: Diagnosis not present

## 2021-08-11 DIAGNOSIS — M6281 Muscle weakness (generalized): Secondary | ICD-10-CM | POA: Insufficient documentation

## 2021-08-11 DIAGNOSIS — Z0000 Encounter for general adult medical examination without abnormal findings: Secondary | ICD-10-CM | POA: Diagnosis not present

## 2021-08-11 DIAGNOSIS — R7301 Impaired fasting glucose: Secondary | ICD-10-CM | POA: Diagnosis not present

## 2021-08-11 DIAGNOSIS — H40059 Ocular hypertension, unspecified eye: Secondary | ICD-10-CM | POA: Diagnosis not present

## 2021-08-11 DIAGNOSIS — I700 Atherosclerosis of aorta: Secondary | ICD-10-CM | POA: Diagnosis not present

## 2021-08-11 DIAGNOSIS — E782 Mixed hyperlipidemia: Secondary | ICD-10-CM | POA: Diagnosis not present

## 2021-08-11 DIAGNOSIS — E875 Hyperkalemia: Secondary | ICD-10-CM | POA: Diagnosis not present

## 2021-08-11 DIAGNOSIS — E871 Hypo-osmolality and hyponatremia: Secondary | ICD-10-CM | POA: Diagnosis not present

## 2021-08-11 DIAGNOSIS — I10 Essential (primary) hypertension: Secondary | ICD-10-CM | POA: Diagnosis not present

## 2021-09-02 DIAGNOSIS — H04123 Dry eye syndrome of bilateral lacrimal glands: Secondary | ICD-10-CM | POA: Diagnosis not present

## 2021-09-28 DIAGNOSIS — H353122 Nonexudative age-related macular degeneration, left eye, intermediate dry stage: Secondary | ICD-10-CM | POA: Diagnosis not present

## 2021-09-28 DIAGNOSIS — H35422 Microcystoid degeneration of retina, left eye: Secondary | ICD-10-CM | POA: Diagnosis not present

## 2021-09-28 DIAGNOSIS — H35373 Puckering of macula, bilateral: Secondary | ICD-10-CM | POA: Diagnosis not present

## 2021-09-28 DIAGNOSIS — H43812 Vitreous degeneration, left eye: Secondary | ICD-10-CM | POA: Diagnosis not present

## 2022-01-17 DIAGNOSIS — M545 Low back pain, unspecified: Secondary | ICD-10-CM | POA: Diagnosis not present

## 2022-01-25 DIAGNOSIS — M545 Low back pain, unspecified: Secondary | ICD-10-CM | POA: Diagnosis not present

## 2022-01-31 DIAGNOSIS — M5416 Radiculopathy, lumbar region: Secondary | ICD-10-CM | POA: Diagnosis not present

## 2022-02-02 DIAGNOSIS — M48062 Spinal stenosis, lumbar region with neurogenic claudication: Secondary | ICD-10-CM | POA: Diagnosis not present

## 2022-02-08 DIAGNOSIS — R350 Frequency of micturition: Secondary | ICD-10-CM | POA: Insufficient documentation

## 2022-02-09 DIAGNOSIS — R7301 Impaired fasting glucose: Secondary | ICD-10-CM | POA: Diagnosis not present

## 2022-02-09 DIAGNOSIS — E782 Mixed hyperlipidemia: Secondary | ICD-10-CM | POA: Diagnosis not present

## 2022-02-11 DIAGNOSIS — M6281 Muscle weakness (generalized): Secondary | ICD-10-CM | POA: Diagnosis not present

## 2022-02-11 DIAGNOSIS — I10 Essential (primary) hypertension: Secondary | ICD-10-CM | POA: Diagnosis not present

## 2022-02-11 DIAGNOSIS — I700 Atherosclerosis of aorta: Secondary | ICD-10-CM | POA: Insufficient documentation

## 2022-02-11 DIAGNOSIS — R7303 Prediabetes: Secondary | ICD-10-CM | POA: Insufficient documentation

## 2022-02-11 DIAGNOSIS — Z0001 Encounter for general adult medical examination with abnormal findings: Secondary | ICD-10-CM | POA: Diagnosis not present

## 2022-02-11 DIAGNOSIS — E871 Hypo-osmolality and hyponatremia: Secondary | ICD-10-CM | POA: Diagnosis not present

## 2022-02-11 DIAGNOSIS — E782 Mixed hyperlipidemia: Secondary | ICD-10-CM | POA: Diagnosis not present

## 2022-02-11 DIAGNOSIS — H40059 Ocular hypertension, unspecified eye: Secondary | ICD-10-CM | POA: Diagnosis not present

## 2022-02-11 DIAGNOSIS — M4807 Spinal stenosis, lumbosacral region: Secondary | ICD-10-CM | POA: Insufficient documentation

## 2022-02-11 DIAGNOSIS — G3184 Mild cognitive impairment, so stated: Secondary | ICD-10-CM | POA: Diagnosis not present

## 2022-02-11 DIAGNOSIS — E875 Hyperkalemia: Secondary | ICD-10-CM | POA: Diagnosis not present

## 2022-02-21 DIAGNOSIS — R350 Frequency of micturition: Secondary | ICD-10-CM | POA: Diagnosis not present

## 2022-02-21 DIAGNOSIS — I10 Essential (primary) hypertension: Secondary | ICD-10-CM | POA: Diagnosis not present

## 2022-02-21 DIAGNOSIS — E871 Hypo-osmolality and hyponatremia: Secondary | ICD-10-CM | POA: Diagnosis not present

## 2022-02-25 DIAGNOSIS — M25551 Pain in right hip: Secondary | ICD-10-CM | POA: Diagnosis not present

## 2022-02-25 DIAGNOSIS — M25552 Pain in left hip: Secondary | ICD-10-CM | POA: Diagnosis not present

## 2022-03-02 DIAGNOSIS — M5451 Vertebrogenic low back pain: Secondary | ICD-10-CM | POA: Diagnosis not present

## 2022-03-08 DIAGNOSIS — M5451 Vertebrogenic low back pain: Secondary | ICD-10-CM | POA: Diagnosis not present

## 2022-03-09 DIAGNOSIS — E871 Hypo-osmolality and hyponatremia: Secondary | ICD-10-CM | POA: Diagnosis not present

## 2022-03-09 DIAGNOSIS — N3000 Acute cystitis without hematuria: Secondary | ICD-10-CM | POA: Diagnosis not present

## 2022-03-09 DIAGNOSIS — R600 Localized edema: Secondary | ICD-10-CM | POA: Diagnosis not present

## 2022-03-09 DIAGNOSIS — M792 Neuralgia and neuritis, unspecified: Secondary | ICD-10-CM | POA: Diagnosis not present

## 2022-03-15 DIAGNOSIS — M5451 Vertebrogenic low back pain: Secondary | ICD-10-CM | POA: Diagnosis not present

## 2022-03-22 DIAGNOSIS — M5451 Vertebrogenic low back pain: Secondary | ICD-10-CM | POA: Diagnosis not present

## 2022-03-29 DIAGNOSIS — R0981 Nasal congestion: Secondary | ICD-10-CM | POA: Insufficient documentation

## 2022-03-29 DIAGNOSIS — M5451 Vertebrogenic low back pain: Secondary | ICD-10-CM | POA: Diagnosis not present

## 2022-03-29 DIAGNOSIS — R059 Cough, unspecified: Secondary | ICD-10-CM | POA: Insufficient documentation

## 2022-03-30 DIAGNOSIS — R0981 Nasal congestion: Secondary | ICD-10-CM | POA: Diagnosis not present

## 2022-03-30 DIAGNOSIS — R059 Cough, unspecified: Secondary | ICD-10-CM | POA: Diagnosis not present

## 2022-03-30 DIAGNOSIS — R519 Headache, unspecified: Secondary | ICD-10-CM | POA: Diagnosis not present

## 2022-04-05 DIAGNOSIS — M5451 Vertebrogenic low back pain: Secondary | ICD-10-CM | POA: Diagnosis not present

## 2022-04-06 DIAGNOSIS — J302 Other seasonal allergic rhinitis: Secondary | ICD-10-CM | POA: Diagnosis not present

## 2022-04-06 DIAGNOSIS — J019 Acute sinusitis, unspecified: Secondary | ICD-10-CM | POA: Diagnosis not present

## 2022-04-06 DIAGNOSIS — B9689 Other specified bacterial agents as the cause of diseases classified elsewhere: Secondary | ICD-10-CM | POA: Diagnosis not present

## 2022-04-06 DIAGNOSIS — M48061 Spinal stenosis, lumbar region without neurogenic claudication: Secondary | ICD-10-CM | POA: Diagnosis not present

## 2022-04-12 DIAGNOSIS — M5451 Vertebrogenic low back pain: Secondary | ICD-10-CM | POA: Diagnosis not present

## 2022-04-13 DIAGNOSIS — M5416 Radiculopathy, lumbar region: Secondary | ICD-10-CM | POA: Diagnosis not present

## 2022-04-14 DIAGNOSIS — H906 Mixed conductive and sensorineural hearing loss, bilateral: Secondary | ICD-10-CM | POA: Diagnosis not present

## 2022-05-03 DIAGNOSIS — M5416 Radiculopathy, lumbar region: Secondary | ICD-10-CM | POA: Diagnosis not present

## 2022-05-24 ENCOUNTER — Ambulatory Visit (HOSPITAL_COMMUNITY): Payer: Medicare PPO | Attending: Physical Medicine and Rehabilitation

## 2022-05-24 ENCOUNTER — Other Ambulatory Visit: Payer: Self-pay

## 2022-05-24 DIAGNOSIS — M5416 Radiculopathy, lumbar region: Secondary | ICD-10-CM | POA: Diagnosis not present

## 2022-05-24 DIAGNOSIS — M6281 Muscle weakness (generalized): Secondary | ICD-10-CM | POA: Diagnosis not present

## 2022-05-24 DIAGNOSIS — R262 Difficulty in walking, not elsewhere classified: Secondary | ICD-10-CM | POA: Diagnosis not present

## 2022-05-24 NOTE — Therapy (Addendum)
OUTPATIENT PHYSICAL THERAPY THORACOLUMBAR EVALUATION   Patient Name: Theresa Morris MRN: [id number] DOB:1931/06/08, 89 y.o., female Today's Date: 05/24/2022  END OF SESSION:  PT End of Session - 05/24/22 1400     Visit Number 1    Number of Visits 8    Date for PT Re-Evaluation 06/24/22    Authorization Type [employer] submitted for auth please check    PT Start Time 0150    PT Stop Time 0230    PT Time Calculation (min) 40 min    Activity Tolerance Patient tolerated treatment well    Behavior During Therapy [facility] for tasks assessed/performed             Past Medical History:  Diagnosis Date   Arthritis    Complication of anesthesia    was told she had a small airway   Glaucoma    Hyperlipemia    Hypertension    TIA (transient ischemic attack)    had a finger go limp-was told tia.   Past Surgical History:  Procedure Laterality Date   CESAREAN SECTION     EYE SURGERY  2009   rt eye   TONSILLECTOMY     TRIGGER FINGER RELEASE  09/27/2011   Procedure: RELEASE TRIGGER FINGER/A-1 PULLEY;  Surgeon: Nicki Reaper, MD;  Location: [facility];  Service: Orthopedics;  Laterality: Left;  Left middle finger   Patient Active Problem List   Diagnosis Date Noted   PVD (peripheral vascular disease) ([facility]) 11/05/2013    PCP: Benita Stabile MD  REFERRING PROVIDER: Claria Dice, MD  REFERRING DIAG: LUMBAR RADICULOPATHY  Rationale for Evaluation and Treatment: Rehabilitation  THERAPY DIAG:  Radiculopathy, lumbar region  Difficulty in walking, not elsewhere classified  ONSET DATE: December 2023  SUBJECTIVE:                                                                                                                                                                                           SUBJECTIVE STATEMENT: Pain started at Christmas time left hip area; had some trouble walking; got to where she could not walk.  Went to see MD; x-ray; diagnosed with scoliosis and  stenosis; tried shots in the back and that did not work; trial of acupuncture and that also did not work.  Tried a shot again about 4 weeks ago and now much better; using a cane now; prior to December was walking without AD.  Living alone and currently having her children, grand children and great grands checking on her daily  PERTINENT HISTORY:  Essential tremor Reports some new memory issues (short term memories)  PAIN:  Are you having pain? Yes: NPRS scale: 0-7/10 Pain location: low back and left hip Pain description: aching and throbbing Aggravating factors: as the day would go along; being up and moving around Relieving factors: injection; tylenol ; gabapentin and meloxicam  PRECAUTIONS: Fall  WEIGHT BEARING RESTRICTIONS: No  FALLS:  Has patient fallen in last 6 months? Yes. Number of falls 1  wearing life alert; able to get up on her own  LIVING ENVIRONMENT: Lives with: lives alone Lives in: House/apartment Stairs: Yes: Internal: 12 steps; on right going up, on left going up, and can reach both and External: 4-5 steps; on right going up, on left going up, and can reach both lives on main level Has following equipment at home: Single point cane, Walker - 2 wheeled, and shower chair  OCCUPATION: retired  PLOF: Independent  PATIENT GOALS: learn to walk again; get my balance  NEXT MD VISIT: prn  OBJECTIVE:   DIAGNOSTIC FINDINGS:  None in epic  PATIENT SURVEYS:  FOTO 35    COGNITION: Overall cognitive status: Within functional limits for tasks assessed     SENSATION: WFL   POSTURE: rounded shoulders, forward head, and increased thoracic kyphosis  PALPATION: General soreness  LUMBAR ROM:   AROM eval  Flexion 50% available  Extension 20% available  Right lateral flexion   Left lateral flexion   Right rotation   Left rotation    (Blank rows = not tested)  LOWER EXTREMITY ROM:     Active  Right eval Left eval  Hip flexion    Hip extension    Hip  abduction    Hip adduction    Hip internal rotation    Hip external rotation    Knee flexion    Knee extension    Ankle dorsiflexion    Ankle plantarflexion    Ankle inversion    Ankle eversion     (Blank rows = not tested)  LOWER EXTREMITY MMT:    MMT Right eval Left eval  Hip flexion 5 4-  Hip extension    Hip abduction    Hip adduction    Hip internal rotation    Hip external rotation    Knee flexion    Knee extension 5 4+  Ankle dorsiflexion 5 4  Ankle plantarflexion    Ankle inversion    Ankle eversion     (Blank rows = not tested)  FUNCTIONAL TESTS:  5 times sit to stand: 20.32 sec SLS 0 sec bilaterally  GAIT: Distance walked: 50 ft Assistive device utilized: Single point cane Level of assistance: Modified independence Comments: mild path deviation noted  TODAY'S TREATMENT:                                                                                                                              DATE: 05/24/22 physical therapy evaluation and HEP    PATIENT EDUCATION:  Education details: Patient educated on exam findings, POC, scope  of PT, HEP, and what to expect next visit. Person educated: Patient Education method: Explanation, Demonstration, and Handouts Education comprehension: verbalized understanding, returned demonstration, verbal cues required, and tactile cues required   HOME EXERCISE PROGRAM: 05/24/22 sit to stand, SLS  ASSESSMENT:  CLINICAL IMPRESSION: Patient is a 89 y.o. female who was seen today for physical therapy evaluation and treatment for LUMBAR RADICULOPATHY. Patient demonstrates muscle weakness, reduced ROM, impaired balance and fascial restrictions which are likely contributing to symptoms of pain and are negatively impacting patient ability to perform ADLs and functional mobility tasks. Patient will benefit from skilled physical therapy services to address these deficits to reduce pain and improve level of function with ADLs and  functional mobility tasks.   OBJECTIVE IMPAIRMENTS: Abnormal gait, decreased activity tolerance, decreased balance, decreased endurance, decreased mobility, difficulty walking, decreased ROM, decreased strength, hypomobility, increased fascial restrictions, impaired perceived functional ability, impaired flexibility, and pain.   ACTIVITY LIMITATIONS: carrying, lifting, bending, sitting, standing, squatting, sleeping, stairs, transfers, bed mobility, reach over head, hygiene/grooming, and locomotion level  PARTICIPATION LIMITATIONS: meal prep, cleaning, laundry, driving, shopping, community activity, and yard work    [facility] POTENTIAL: Good  CLINICAL DECISION MAKING: Stable/uncomplicated  EVALUATION COMPLEXITY: Low   GOALS: Goals reviewed with patient? No  SHORT TERM GOALS: Target date: 06/07/2022  patient will be independent with initial HEP  Baseline: Goal status: INITIAL  2.  Patient will self report 30% improvement to improve tolerance for functional activity  Baseline:  Goal status: INITIAL    LONG TERM GOALS: Target date: 06/21/2022  Patient will be independent in self management strategies to improve quality of life and functional outcomes.  Baseline:  Goal status: INITIAL  2.  Patient will self report 50% improvement to improve tolerance for functional activity  Baseline:  Goal status: INITIAL  3.  Patient will improve FOTO score by 10 points to demonstrate improved perceived function   Baseline: 35 Goal status: INITIAL  4.  Patient will be able to stand on each leg x 5 sec SLS to demonstrate improved functional balance Baseline:  Goal status: INITIAL  5.  Patient will improve 5 times sit to stand score from 20.32 sec to 16 sec to demonstrate improved functional mobility and increased lower extremity strength.  Baseline:  Goal status: INITIAL  PLAN:  PT FREQUENCY: 2x/week  PT DURATION: 4 weeks  PLANNED INTERVENTIONS: Therapeutic exercises,  Therapeutic activity, Neuromuscular re-education, Balance training, Gait training, Patient/Family education, Joint manipulation, Joint mobilization, Stair training, Orthotic/Fit training, DME instructions, Aquatic Therapy, Dry Needling, Electrical stimulation, Spinal manipulation, Spinal mobilization, Cryotherapy, Moist heat, Compression bandaging, scar mobilization, Splintting, Taping, Traction, Ultrasound, Ionotophoresis 4mg /ml Dexamethasone, and Manual therapy .  PLAN FOR NEXT SESSION: Review of HEP and goals; lumbar mobility and core strength, balance; functional strength  3:29 PM, 05/24/22 Damarie Schoolfield Small Clarisse Rodriges MPT [facility] [place] [phone] [phone]

## 2022-05-27 ENCOUNTER — Encounter (HOSPITAL_COMMUNITY): Payer: Self-pay

## 2022-05-27 ENCOUNTER — Ambulatory Visit (HOSPITAL_COMMUNITY): Payer: Medicare PPO

## 2022-05-27 DIAGNOSIS — M6281 Muscle weakness (generalized): Secondary | ICD-10-CM | POA: Diagnosis not present

## 2022-05-27 DIAGNOSIS — R262 Difficulty in walking, not elsewhere classified: Secondary | ICD-10-CM | POA: Diagnosis not present

## 2022-05-27 DIAGNOSIS — M5416 Radiculopathy, lumbar region: Secondary | ICD-10-CM

## 2022-05-27 NOTE — Therapy (Signed)
OUTPATIENT PHYSICAL THERAPY THORACOLUMBAR TREATMENT   Patient Name: Theresa Morris MRN: [id number] DOB:1931-10-19, 89 y.o., female Today's Date: 05/27/2022  END OF SESSION:  PT End of Session - 05/27/22 1304     Visit Number 2    Number of Visits 8    Date for PT Re-Evaluation 06/24/22    Authorization Type [employer] approves 1 re-eval and 8 visits    Authorization Time Period 5/21--06/24/22    Authorization - Visit Number 1    Authorization - Number of Visits 8    Progress Note Due on Visit 8    PT Start Time 1051    PT Stop Time 1135    PT Time Calculation (min) 44 min    Equipment Utilized During Treatment --   SBA during gait   Activity Tolerance Patient tolerated treatment well    Behavior During Therapy WFL for tasks assessed/performed              Past Medical History:  Diagnosis Date   Arthritis    Complication of anesthesia    was told she had a small airway   Glaucoma    Hyperlipemia    Hypertension    TIA (transient ischemic attack)    had a finger go limp-was told tia.   Past Surgical History:  Procedure Laterality Date   CESAREAN SECTION     EYE SURGERY  2009   rt eye   TONSILLECTOMY     TRIGGER FINGER RELEASE  09/27/2011   Procedure: RELEASE TRIGGER FINGER/A-1 PULLEY;  Surgeon: Nicki Reaper, MD;  Location: [facility];  Service: Orthopedics;  Laterality: Left;  Left middle finger   Patient Active Problem List   Diagnosis Date Noted   PVD (peripheral vascular disease) ([facility]) 11/05/2013    PCP: Benita Stabile MD  REFERRING PROVIDER: Claria Dice, MD  REFERRING DIAG: LUMBAR RADICULOPATHY  Rationale for Evaluation and Treatment: Rehabilitation  THERAPY DIAG:  Radiculopathy, lumbar region  Difficulty in walking, not elsewhere classified  Muscle weakness (generalized)  ONSET DATE: December 2023  SUBJECTIVE:                                                                                                                                                                                            SUBJECTIVE STATEMENT: 05/27/22:  Pt stated she feels she over did it earlier this week, pain is not constant and no reports of pain today.  Arrived with [facility].  Feels she is weak today.  Admits she has not began the HEP yet.    Eval:  Pain started at Christmas time left hip area;  had some trouble walking; got to where she could not walk.  Went to see MD; x-ray; diagnosed with scoliosis and stenosis; tried shots in the back and that did not work; trial of acupuncture and that also did not work.  Tried a shot again about 4 weeks ago and now much better; using a cane now; prior to December was walking without AD.  Living alone and currently having her children, grand children and great grands checking on her daily  PERTINENT HISTORY:  Essential tremor Reports some new memory issues (short term memories)  PAIN:  Are you having pain? Yes: NPRS scale: 0-7/10 Pain location: low back and left hip Pain description: aching and throbbing Aggravating factors: as the day would go along; being up and moving around Relieving factors: injection; tylenol ; gabapentin and meloxicam  PRECAUTIONS: Fall  WEIGHT BEARING RESTRICTIONS: No  FALLS:  Has patient fallen in last 6 months? Yes. Number of falls 1  wearing life alert; able to get up on her own  LIVING ENVIRONMENT: Lives with: lives alone Lives in: House/apartment Stairs: Yes: Internal: 12 steps; on right going up, on left going up, and can reach both and External: 4-5 steps; on right going up, on left going up, and can reach both lives on main level Has following equipment at home: Single point cane, Walker - 2 wheeled, and shower chair  OCCUPATION: retired  PLOF: Independent  PATIENT GOALS: learn to walk again; get my balance  NEXT MD VISIT: prn  OBJECTIVE:   DIAGNOSTIC FINDINGS:  None in epic  PATIENT SURVEYS:  FOTO 35    COGNITION: Overall cognitive status: Within  functional limits for tasks assessed     SENSATION: WFL   POSTURE: rounded shoulders, forward head, and increased thoracic kyphosis  PALPATION: General soreness  LUMBAR ROM:   AROM eval  Flexion 50% available  Extension 20% available  Right lateral flexion   Left lateral flexion   Right rotation   Left rotation    (Blank rows = not tested)  LOWER EXTREMITY ROM:     Active  Right eval Left eval  Hip flexion    Hip extension    Hip abduction    Hip adduction    Hip internal rotation    Hip external rotation    Knee flexion    Knee extension    Ankle dorsiflexion    Ankle plantarflexion    Ankle inversion    Ankle eversion     (Blank rows = not tested)  LOWER EXTREMITY MMT:    MMT Right eval Left eval  Hip flexion 5 4-  Hip extension    Hip abduction    Hip adduction    Hip internal rotation    Hip external rotation    Knee flexion    Knee extension 5 4+  Ankle dorsiflexion 5 4  Ankle plantarflexion    Ankle inversion    Ankle eversion     (Blank rows = not tested)  FUNCTIONAL TESTS:  5 times sit to stand: 20.32 sec SLS 0 sec bilaterally  GAIT: Distance walked: 50 ft Assistive device utilized: Single point cane Level of assistance: Modified independence Comments: mild path deviation noted  TODAY'S TREATMENT:  DATE:  05/27/22: Reviewed goals Educated importance of HEP compliance  Reviewed current HEP: STS 10x with cueing for mechanics SLS Rt 12", Lt 10"  Supine: March with ab set 10x5" Bridge 10x 5"  Sidelying:  Clam 10x 5"  Gait training iwht SPC, adjusted cane height, proper hand use and cueing for sequence 210ft SBA  05/24/22 physical therapy evaluation and HEP    PATIENT EDUCATION:  Education details: Patient educated on exam findings, POC, scope of PT, HEP, and what to expect next visit. Person  educated: Patient Education method: Explanation, Demonstration, and Handouts Education comprehension: verbalized understanding, returned demonstration, verbal cues required, and tactile cues required   HOME EXERCISE PROGRAM: Access Code: [email or web address] URL: [email or web address] Date: 05/27/2022 Prepared by: Becky Sax  Exercises - Sit to Stand Without Arm Support  - 1 x daily - 7 x weekly - 3 sets - 10 reps - Single Leg Stance  - 1 x daily - 7 x weekly - 3 sets - 10 reps - Supine Bridge  - 1 x daily - 7 x weekly - 3 sets - 10 reps  05/24/22 sit to stand, SLS  ASSESSMENT:  CLINICAL IMPRESSION: 05/27/22:  Reviewed goals and educated importance of HEP compliance for maximal benefits.  Pt admits to not beginning HEP yet though able to recall exercise.  Pt required cueing for mechanics with sit to stand for increased ease.  Session focus with core stability and proximal strengthening.  Pt educated on proper hand use and proper 2 point sequence with SPC to support Lt LE for pain control and to improve gait mechanics.  Pt given additional print out of HEP to ensure good mechanics at home for improved follow through.  Eval: Patient is a 89 y.o. female who was seen today for physical therapy evaluation and treatment for LUMBAR RADICULOPATHY. Patient demonstrates muscle weakness, reduced ROM, impaired balance and fascial restrictions which are likely contributing to symptoms of pain and are negatively impacting patient ability to perform ADLs and functional mobility tasks. Patient will benefit from skilled physical therapy services to address these deficits to reduce pain and improve level of function with ADLs and functional mobility tasks.   OBJECTIVE IMPAIRMENTS: Abnormal gait, decreased activity tolerance, decreased balance, decreased endurance, decreased mobility, difficulty walking, decreased ROM, decreased strength, hypomobility, increased fascial restrictions, impaired perceived  functional ability, impaired flexibility, and pain.   ACTIVITY LIMITATIONS: carrying, lifting, bending, sitting, standing, squatting, sleeping, stairs, transfers, bed mobility, reach over head, hygiene/grooming, and locomotion level  PARTICIPATION LIMITATIONS: meal prep, cleaning, laundry, driving, shopping, community activity, and yard work    [facility] POTENTIAL: Good  CLINICAL DECISION MAKING: Stable/uncomplicated  EVALUATION COMPLEXITY: Low   GOALS: Goals reviewed with patient? No  SHORT TERM GOALS: Target date: 06/07/2022  patient will be independent with initial HEP  Baseline: Goal status: IN PROGRESS  2.  Patient will self report 30% improvement to improve tolerance for functional activity  Baseline:  Goal status: IN PROGRESS    LONG TERM GOALS: Target date: 06/21/2022  Patient will be independent in self management strategies to improve quality of life and functional outcomes.  Baseline:  Goal status: IN PROGRESS  2.  Patient will self report 50% improvement to improve tolerance for functional activity  Baseline:  Goal status: IN PROGRESS  3.  Patient will improve FOTO score by 10 points to demonstrate improved perceived function   Baseline: 35 Goal status: IN PROGRESS  4.  Patient will be able to stand on each leg x 5  sec SLS to demonstrate improved functional balance Baseline:  Goal status: IN PROGRESS  5.  Patient will improve 5 times sit to stand score from 20.32 sec to 16 sec to demonstrate improved functional mobility and increased lower extremity strength.  Baseline:  Goal status: IN PROGRESS  PLAN:  PT FREQUENCY: 2x/week  PT DURATION: 4 weeks  PLANNED INTERVENTIONS: Therapeutic exercises, Therapeutic activity, Neuromuscular re-education, Balance training, Gait training, Patient/Family education, Joint manipulation, Joint mobilization, Stair training, Orthotic/Fit training, DME instructions, Aquatic Therapy, Dry Needling, Electrical  stimulation, Spinal manipulation, Spinal mobilization, Cryotherapy, Moist heat, Compression bandaging, scar mobilization, Splintting, Taping, Traction, Ultrasound, Ionotophoresis 4mg /ml Dexamethasone, and Manual therapy .  PLAN FOR NEXT SESSION: F/U with HEP compliance; lumbar mobility and core strength, balance; functional strength   Becky Sax, LPTA/CLT; CBIS [phone]  1:09 PM, 05/27/22

## 2022-06-02 ENCOUNTER — Ambulatory Visit (HOSPITAL_COMMUNITY): Payer: Medicare PPO

## 2022-06-02 DIAGNOSIS — M5416 Radiculopathy, lumbar region: Secondary | ICD-10-CM

## 2022-06-02 DIAGNOSIS — M6281 Muscle weakness (generalized): Secondary | ICD-10-CM | POA: Diagnosis not present

## 2022-06-02 DIAGNOSIS — R262 Difficulty in walking, not elsewhere classified: Secondary | ICD-10-CM | POA: Diagnosis not present

## 2022-06-02 NOTE — Therapy (Signed)
OUTPATIENT PHYSICAL THERAPY THORACOLUMBAR TREATMENT   Patient Name: Theresa Morris MRN: [id number] DOB:[birth year], 89 y.o., female Today's Date: 06/02/2022  END OF SESSION:  PT End of Session - 06/02/22 1402     Visit Number 3    Number of Visits 8    Date for PT Re-Evaluation 06/24/22    Authorization Type [employer] approves 1 re-eval and 8 visits    Authorization Time Period 5/21--06/24/22    Authorization - Visit Number 2    Authorization - Number of Visits 8    Progress Note Due on Visit 8    PT Start Time 1402    PT Stop Time 1445    PT Time Calculation (min) 43 min    Equipment Utilized During Treatment --   SBA during gait   Activity Tolerance Patient tolerated treatment well    Behavior During Therapy WFL for tasks assessed/performed              Past Medical History:  Diagnosis Date   Arthritis    Complication of anesthesia    was told she had a small airway   Glaucoma    Hyperlipemia    Hypertension    TIA (transient ischemic attack)    had a finger go limp-was told tia.   Past Surgical History:  Procedure Laterality Date   CESAREAN SECTION     EYE SURGERY  2009   rt eye   TONSILLECTOMY     TRIGGER FINGER RELEASE  09/27/2011   Procedure: RELEASE TRIGGER FINGER/A-1 PULLEY;  Surgeon: Nicki Reaper, MD;  Location: [facility];  Service: Orthopedics;  Laterality: Left;  Left middle finger   Patient Active Problem List   Diagnosis Date Noted   PVD (peripheral vascular disease) (HCC) 11/05/2013    PCP: Benita Stabile MD  REFERRING PROVIDER: Claria Dice, MD  REFERRING DIAG: LUMBAR RADICULOPATHY  Rationale for Evaluation and Treatment: Rehabilitation  THERAPY DIAG:  Radiculopathy, lumbar region  Difficulty in walking, not elsewhere classified  Muscle weakness (generalized)  ONSET DATE: December 2023  SUBJECTIVE:                                                                                                                                                                                            SUBJECTIVE STATEMENT: Patient reports she is "really sore" today.  Left leg pain today.   Tried cane in right hand but doesn't feel comfortable with that.    Eval:  Pain started at Christmas time left hip area; had some trouble walking; got to where she could not walk.  Went to see MD; x-ray;  diagnosed with scoliosis and stenosis; tried shots in the back and that did not work; trial of acupuncture and that also did not work.  Tried a shot again about 4 weeks ago and now much better; using a cane now; prior to December was walking without AD.  Living alone and currently having her children, grand children and great grands checking on her daily  PERTINENT HISTORY:  Essential tremor Reports some new memory issues (short term memories)  PAIN:  Are you having pain? Yes: NPRS scale: 0-7/10 Pain location: low back and left hip Pain description: aching and throbbing Aggravating factors: as the day would go along; being up and moving around Relieving factors: injection; tylenol ; gabapentin and meloxicam  PRECAUTIONS: Fall  WEIGHT BEARING RESTRICTIONS: No  FALLS:  Has patient fallen in last 6 months? Yes. Number of falls 1  wearing life alert; able to get up on her own  LIVING ENVIRONMENT: Lives with: lives alone Lives in: House/apartment Stairs: Yes: Internal: 12 steps; on right going up, on left going up, and can reach both and External: 4-5 steps; on right going up, on left going up, and can reach both lives on main level Has following equipment at home: Single point cane, Walker - 2 wheeled, and shower chair  OCCUPATION: retired  PLOF: Independent  PATIENT GOALS: learn to walk again; get my balance  NEXT MD VISIT: prn  OBJECTIVE:   DIAGNOSTIC FINDINGS:  None in epic  PATIENT SURVEYS:  FOTO 35    COGNITION: Overall cognitive status: Within functional limits for tasks assessed     SENSATION: WFL   POSTURE:  rounded shoulders, forward head, and increased thoracic kyphosis  PALPATION: General soreness  LUMBAR ROM:   AROM eval  Flexion 50% available  Extension 20% available  Right lateral flexion   Left lateral flexion   Right rotation   Left rotation    (Blank rows = not tested)  LOWER EXTREMITY ROM:     Active  Right eval Left eval  Hip flexion    Hip extension    Hip abduction    Hip adduction    Hip internal rotation    Hip external rotation    Knee flexion    Knee extension    Ankle dorsiflexion    Ankle plantarflexion    Ankle inversion    Ankle eversion     (Blank rows = not tested)  LOWER EXTREMITY MMT:    MMT Right eval Left eval  Hip flexion 5 4-  Hip extension    Hip abduction    Hip adduction    Hip internal rotation    Hip external rotation    Knee flexion    Knee extension 5 4+  Ankle dorsiflexion 5 4  Ankle plantarflexion    Ankle inversion    Ankle eversion     (Blank rows = not tested)  FUNCTIONAL TESTS:  5 times sit to stand: 20.32 sec SLS 0 sec bilaterally  GAIT: Distance walked: 50 ft Assistive device utilized: Single point cane Level of assistance: Modified independence Comments: mild path deviation noted  TODAY'S TREATMENT:  DATE:  06/02/22 Supine: Decompression exercises  Head press 5" hold x 8 Leg press 5" hold x 8 Shoulder press 5" hold x 8 Leg lengthener 5" hold x 8 Decompression in hooklying x 2'  05/27/22: Reviewed goals Educated importance of HEP compliance  Reviewed current HEP: STS 10x with cueing for mechanics SLS Rt 12", Lt 10"  Supine: March with ab set 10x5" Bridge 10x 5"  Sidelying:  Clam 10x 5"  Gait training iwht SPC, adjusted cane height, proper hand use and cueing for sequence 257ft SBA  05/24/22 physical therapy evaluation and HEP    PATIENT EDUCATION:  Education  details: Patient educated on exam findings, POC, scope of PT, HEP, and what to expect next visit. Person educated: Patient Education method: Explanation, Demonstration, and Handouts Education comprehension: verbalized understanding, returned demonstration, verbal cues required, and tactile cues required   HOME EXERCISE PROGRAM: Access Code: [email or web address] URL: [email or web address] Date: 05/27/2022 Prepared by: Becky Sax  Exercises - Sit to Stand Without Arm Support  - 1 x daily - 7 x weekly - 3 sets - 10 reps - Single Leg Stance  - 1 x daily - 7 x weekly - 3 sets - 10 reps - Supine Bridge  - 1 x daily - 7 x weekly - 3 sets - 10 reps  05/24/22 sit to stand, SLS  ASSESSMENT:  CLINICAL IMPRESSION: 06/02/22 during today's session we started with some decompression exercise as patient reports some significant soreness today.  Updated HEP.  Patient tried to use cane in right hand but is "very" left handed so patient with hard time using can in the right hand.  She tends to be wobbly with ambulation if she get distracted with talking or turning her head so cautioned patient to focus on her walking and safety; throughout treatment today patient is frustrated with her limitations with memory, back and hip pain and balance.  Patient will benefit from continued skilled therapy services to address deficits and promote return to optimal function.        Eval: Patient is a 89 y.o. female who was seen today for physical therapy evaluation and treatment for LUMBAR RADICULOPATHY. Patient demonstrates muscle weakness, reduced ROM, impaired balance and fascial restrictions which are likely contributing to symptoms of pain and are negatively impacting patient ability to perform ADLs and functional mobility tasks. Patient will benefit from skilled physical therapy services to address these deficits to reduce pain and improve level of function with ADLs and functional mobility tasks.   OBJECTIVE  IMPAIRMENTS: Abnormal gait, decreased activity tolerance, decreased balance, decreased endurance, decreased mobility, difficulty walking, decreased ROM, decreased strength, hypomobility, increased fascial restrictions, impaired perceived functional ability, impaired flexibility, and pain.   ACTIVITY LIMITATIONS: carrying, lifting, bending, sitting, standing, squatting, sleeping, stairs, transfers, bed mobility, reach over head, hygiene/grooming, and locomotion level  PARTICIPATION LIMITATIONS: meal prep, cleaning, laundry, driving, shopping, community activity, and yard work    [facility] POTENTIAL: Good  CLINICAL DECISION MAKING: Stable/uncomplicated  EVALUATION COMPLEXITY: Low   GOALS: Goals reviewed with patient? No  SHORT TERM GOALS: Target date: 06/07/2022  patient will be independent with initial HEP  Baseline: Goal status: IN PROGRESS  2.  Patient will self report 30% improvement to improve tolerance for functional activity  Baseline:  Goal status: IN PROGRESS    LONG TERM GOALS: Target date: 06/21/2022  Patient will be independent in self management strategies to improve quality of life and functional outcomes.  Baseline:  Goal status: IN PROGRESS  2.  Patient will self report 50% improvement to improve tolerance for functional activity  Baseline:  Goal status: IN PROGRESS  3.  Patient will improve FOTO score by 10 points to demonstrate improved perceived function   Baseline: 35 Goal status: IN PROGRESS  4.  Patient will be able to stand on each leg x 5 sec SLS to demonstrate improved functional balance Baseline:  Goal status: IN PROGRESS  5.  Patient will improve 5 times sit to stand score from 20.32 sec to 16 sec to demonstrate improved functional mobility and increased lower extremity strength.  Baseline:  Goal status: IN PROGRESS  PLAN:  PT FREQUENCY: 2x/week  PT DURATION: 4 weeks  PLANNED INTERVENTIONS: Therapeutic exercises, Therapeutic  activity, Neuromuscular re-education, Balance training, Gait training, Patient/Family education, Joint manipulation, Joint mobilization, Stair training, Orthotic/Fit training, DME instructions, Aquatic Therapy, Dry Needling, Electrical stimulation, Spinal manipulation, Spinal mobilization, Cryotherapy, Moist heat, Compression bandaging, scar mobilization, Splintting, Taping, Traction, Ultrasound, Ionotophoresis 4mg /ml Dexamethasone, and Manual therapy .  PLAN FOR NEXT SESSION: F/U with HEP compliance; lumbar mobility and core strength, balance; functional strength   2:46 PM, 06/02/22 Samhitha Rosen Small Shine Mikes MPT [place] [facility] [place] [phone] Ph:[phone]

## 2022-06-06 ENCOUNTER — Encounter (HOSPITAL_COMMUNITY): Payer: Medicare PPO

## 2022-06-07 DIAGNOSIS — N3000 Acute cystitis without hematuria: Secondary | ICD-10-CM | POA: Insufficient documentation

## 2022-06-07 DIAGNOSIS — Z6823 Body mass index (BMI) 23.0-23.9, adult: Secondary | ICD-10-CM | POA: Diagnosis not present

## 2022-06-07 DIAGNOSIS — R600 Localized edema: Secondary | ICD-10-CM | POA: Insufficient documentation

## 2022-06-07 DIAGNOSIS — M792 Neuralgia and neuritis, unspecified: Secondary | ICD-10-CM | POA: Insufficient documentation

## 2022-06-07 DIAGNOSIS — Z713 Dietary counseling and surveillance: Secondary | ICD-10-CM | POA: Diagnosis not present

## 2022-06-08 ENCOUNTER — Encounter (HOSPITAL_COMMUNITY): Payer: Medicare PPO | Admitting: Physical Therapy

## 2022-06-14 ENCOUNTER — Ambulatory Visit (HOSPITAL_COMMUNITY): Payer: Medicare PPO | Attending: Physical Medicine and Rehabilitation

## 2022-06-14 ENCOUNTER — Encounter (HOSPITAL_COMMUNITY): Payer: Self-pay

## 2022-06-14 DIAGNOSIS — M6281 Muscle weakness (generalized): Secondary | ICD-10-CM | POA: Insufficient documentation

## 2022-06-14 DIAGNOSIS — R262 Difficulty in walking, not elsewhere classified: Secondary | ICD-10-CM | POA: Diagnosis not present

## 2022-06-14 DIAGNOSIS — M5416 Radiculopathy, lumbar region: Secondary | ICD-10-CM | POA: Insufficient documentation

## 2022-06-14 NOTE — Therapy (Signed)
OUTPATIENT PHYSICAL THERAPY THORACOLUMBAR TREATMENT   Patient Name: Theresa Morris MRN: [id number] DOB:1931-01-19, 89 y.o., female Today's Date: 06/14/2022  END OF SESSION:  PT End of Session - 06/14/22 1650     Visit Number 4    Number of Visits 8    Date for PT Re-Evaluation 06/24/22    Authorization Type [employer] approves 1 re-eval and 8 visits    Authorization Time Period 5/21--06/24/22    Authorization - Visit Number 3    Authorization - Number of Visits 8    Progress Note Due on Visit 8    PT Start Time 1350    PT Stop Time 1430    PT Time Calculation (min) 40 min    Equipment Utilized During Treatment --   SBA during gait   Activity Tolerance Patient tolerated treatment well    Behavior During Therapy WFL for tasks assessed/performed               Past Medical History:  Diagnosis Date   Arthritis    Complication of anesthesia    was told she had a small airway   Glaucoma    Hyperlipemia    Hypertension    TIA (transient ischemic attack)    had a finger go limp-was told tia.   Past Surgical History:  Procedure Laterality Date   CESAREAN SECTION     EYE SURGERY  2009   rt eye   TONSILLECTOMY     TRIGGER FINGER RELEASE  09/27/2011   Procedure: RELEASE TRIGGER FINGER/A-1 PULLEY;  Surgeon: Nicki Reaper, MD;  Location: [facility];  Service: Orthopedics;  Laterality: Left;  Left middle finger   Patient Active Problem List   Diagnosis Date Noted   PVD (peripheral vascular disease) (HCC) 11/05/2013    PCP: Benita Stabile MD  REFERRING PROVIDER: Claria Dice, MD  REFERRING DIAG: LUMBAR RADICULOPATHY  Rationale for Evaluation and Treatment: Rehabilitation  THERAPY DIAG:  Radiculopathy, lumbar region  Difficulty in walking, not elsewhere classified  Muscle weakness (generalized)  ONSET DATE: December 2023  SUBJECTIVE:                                                                                                                                                                                            SUBJECTIVE STATEMENT: Pt reports she has had UTI and finished medication this morning.  Has been trying to walk with cane in Rt hand, continues to be difficult.  Reports she has not been doing any exercises at home while dealing with UTI.  Currently pain free, reports pain comes and goes.  Eval:  Pain  started at Christmas time left hip area; had some trouble walking; got to where she could not walk.  Went to see MD; x-ray; diagnosed with scoliosis and stenosis; tried shots in the back and that did not work; trial of acupuncture and that also did not work.  Tried a shot again about 4 weeks ago and now much better; using a cane now; prior to December was walking without AD.  Living alone and currently having her children, grand children and great grands checking on her daily  PERTINENT HISTORY:  Essential tremor Reports some new memory issues (short term memories)  PAIN:  Are you having pain? Yes: NPRS scale: 0-7/10 Pain location: low back and left hip Pain description: aching and throbbing Aggravating factors: as the day would go along; being up and moving around Relieving factors: injection; tylenol ; gabapentin and meloxicam  PRECAUTIONS: Fall  WEIGHT BEARING RESTRICTIONS: No  FALLS:  Has patient fallen in last 6 months? Yes. Number of falls 1  wearing life alert; able to get up on her own  LIVING ENVIRONMENT: Lives with: lives alone Lives in: House/apartment Stairs: Yes: Internal: 12 steps; on right going up, on left going up, and can reach both and External: 4-5 steps; on right going up, on left going up, and can reach both lives on main level Has following equipment at home: Single point cane, Walker - 2 wheeled, and shower chair  OCCUPATION: retired  PLOF: Independent  PATIENT GOALS: learn to walk again; get my balance  NEXT MD VISIT: prn  OBJECTIVE:   DIAGNOSTIC FINDINGS:  None in epic  PATIENT SURVEYS:   FOTO 35    COGNITION: Overall cognitive status: Within functional limits for tasks assessed     SENSATION: WFL   POSTURE: rounded shoulders, forward head, and increased thoracic kyphosis  PALPATION: General soreness  LUMBAR ROM:   AROM eval  Flexion 50% available  Extension 20% available  Right lateral flexion   Left lateral flexion   Right rotation   Left rotation    (Blank rows = not tested)  LOWER EXTREMITY ROM:     Active  Right eval Left eval  Hip flexion    Hip extension    Hip abduction    Hip adduction    Hip internal rotation    Hip external rotation    Knee flexion    Knee extension    Ankle dorsiflexion    Ankle plantarflexion    Ankle inversion    Ankle eversion     (Blank rows = not tested)  LOWER EXTREMITY MMT:    MMT Right eval Left eval  Hip flexion 5 4-  Hip extension    Hip abduction    Hip adduction    Hip internal rotation    Hip external rotation    Knee flexion    Knee extension 5 4+  Ankle dorsiflexion 5 4  Ankle plantarflexion    Ankle inversion    Ankle eversion     (Blank rows = not tested)  FUNCTIONAL TESTS:  5 times sit to stand: 20.32 sec SLS 0 sec bilaterally  GAIT: Distance walked: 50 ft Assistive device utilized: Single point cane Level of assistance: Modified independence Comments: mild path deviation noted  TODAY'S TREATMENT:  DATE:  06/14/22 Sidelying Clam 10x 3"  Supine: Bridge 10x 5" LTR 5x 10"   06/02/22 Supine: Decompression exercises  Head press 5" hold x 8 Leg press 5" hold x 8 Shoulder press 5" hold x 8 Leg lengthener 5" hold x 8 Decompression in hooklying x 2'  05/27/22: Reviewed goals Educated importance of HEP compliance  Reviewed current HEP: STS 10x with cueing for mechanics SLS Rt 12", Lt 10"  Supine: March with ab set 10x5" Bridge 10x  5"  Sidelying:  Clam 10x 5"  Gait training iwht SPC, adjusted cane height, proper hand use and cueing for sequence 238ft SBA  05/24/22 physical therapy evaluation and HEP    PATIENT EDUCATION:  Education details: Patient educated on exam findings, POC, scope of PT, HEP, and what to expect next visit. Person educated: Patient Education method: Explanation, Demonstration, and Handouts Education comprehension: verbalized understanding, returned demonstration, verbal cues required, and tactile cues required   HOME EXERCISE PROGRAM: Access Code: [email or web address] URL: [email or web address] Date: 05/27/2022 Prepared by: Becky Sax  Exercises - Sit to Stand Without Arm Support  - 1 x daily - 7 x weekly - 3 sets - 10 reps - Single Leg Stance  - 1 x daily - 7 x weekly - 3 sets - 10 reps - Supine Bridge  - 1 x daily - 7 x weekly - 3 sets - 10 reps  05/24/22 sit to stand, SLS  ASSESSMENT:  CLINICAL IMPRESSION: 06/14/22:  Session focus with core and proximal strengthening as well as stretches for mobility.  Pt c/o some difficulty with memory and concentration through sessions, cueing to improve form and mechanics with all exercises.  Gait training to improve sequence which was difficult for pt, SBA for safety during gait and cueing for sequence.  Added static balance training with intermittent HHA required.  No reports of pain through session.     Eval: Patient is a 89 y.o. female who was seen today for physical therapy evaluation and treatment for LUMBAR RADICULOPATHY. Patient demonstrates muscle weakness, reduced ROM, impaired balance and fascial restrictions which are likely contributing to symptoms of pain and are negatively impacting patient ability to perform ADLs and functional mobility tasks. Patient will benefit from skilled physical therapy services to address these deficits to reduce pain and improve level of function with ADLs and functional mobility tasks.   OBJECTIVE  IMPAIRMENTS: Abnormal gait, decreased activity tolerance, decreased balance, decreased endurance, decreased mobility, difficulty walking, decreased ROM, decreased strength, hypomobility, increased fascial restrictions, impaired perceived functional ability, impaired flexibility, and pain.   ACTIVITY LIMITATIONS: carrying, lifting, bending, sitting, standing, squatting, sleeping, stairs, transfers, bed mobility, reach over head, hygiene/grooming, and locomotion level  PARTICIPATION LIMITATIONS: meal prep, cleaning, laundry, driving, shopping, community activity, and yard work    [facility] POTENTIAL: Good  CLINICAL DECISION MAKING: Stable/uncomplicated  EVALUATION COMPLEXITY: Low   GOALS: Goals reviewed with patient? No  SHORT TERM GOALS: Target date: 06/07/2022  patient will be independent with initial HEP  Baseline: Goal status: IN PROGRESS  2.  Patient will self report 30% improvement to improve tolerance for functional activity  Baseline:  Goal status: IN PROGRESS    LONG TERM GOALS: Target date: 06/21/2022  Patient will be independent in self management strategies to improve quality of life and functional outcomes.  Baseline:  Goal status: IN PROGRESS  2.  Patient will self report 50% improvement to improve tolerance for functional activity  Baseline:  Goal status: IN PROGRESS  3.  Patient will improve  FOTO score by 10 points to demonstrate improved perceived function   Baseline: 35 Goal status: IN PROGRESS  4.  Patient will be able to stand on each leg x 5 sec SLS to demonstrate improved functional balance Baseline:  Goal status: IN PROGRESS  5.  Patient will improve 5 times sit to stand score from 20.32 sec to 16 sec to demonstrate improved functional mobility and increased lower extremity strength.  Baseline:  Goal status: IN PROGRESS  PLAN:  PT FREQUENCY: 2x/week  PT DURATION: 4 weeks  PLANNED INTERVENTIONS: Therapeutic exercises, Therapeutic  activity, Neuromuscular re-education, Balance training, Gait training, Patient/Family education, Joint manipulation, Joint mobilization, Stair training, Orthotic/Fit training, DME instructions, Aquatic Therapy, Dry Needling, Electrical stimulation, Spinal manipulation, Spinal mobilization, Cryotherapy, Moist heat, Compression bandaging, scar mobilization, Splintting, Taping, Traction, Ultrasound, Ionotophoresis 4mg /ml Dexamethasone, and Manual therapy .  PLAN FOR NEXT SESSION: F/U with HEP compliance; lumbar mobility and core strength, balance; functional strength  Becky Sax, LPTA/CLT; CBIS [phone]  Juel Burrow, PTA 06/14/2022, 4:51 PM 4:51 PM, 06/14/22

## 2022-06-16 ENCOUNTER — Ambulatory Visit (HOSPITAL_COMMUNITY): Payer: Medicare PPO

## 2022-06-16 ENCOUNTER — Encounter (HOSPITAL_COMMUNITY): Payer: Self-pay

## 2022-06-17 ENCOUNTER — Encounter (INDEPENDENT_AMBULATORY_CARE_PROVIDER_SITE_OTHER): Payer: Medicare PPO | Admitting: Ophthalmology

## 2022-06-20 ENCOUNTER — Encounter (HOSPITAL_COMMUNITY): Payer: Medicare PPO

## 2022-06-22 ENCOUNTER — Encounter (HOSPITAL_COMMUNITY): Payer: Medicare PPO

## 2022-06-22 DIAGNOSIS — H9190 Unspecified hearing loss, unspecified ear: Secondary | ICD-10-CM | POA: Insufficient documentation

## 2022-06-22 DIAGNOSIS — N3000 Acute cystitis without hematuria: Secondary | ICD-10-CM | POA: Diagnosis not present

## 2022-06-23 ENCOUNTER — Encounter (HOSPITAL_COMMUNITY): Payer: Medicare PPO

## 2022-06-27 ENCOUNTER — Encounter (HOSPITAL_COMMUNITY): Payer: Medicare PPO

## 2022-07-05 DIAGNOSIS — M545 Low back pain, unspecified: Secondary | ICD-10-CM | POA: Diagnosis not present

## 2022-07-06 ENCOUNTER — Encounter (HOSPITAL_COMMUNITY): Payer: Medicare PPO

## 2022-07-14 DIAGNOSIS — H903 Sensorineural hearing loss, bilateral: Secondary | ICD-10-CM | POA: Diagnosis not present

## 2022-08-17 DIAGNOSIS — R7303 Prediabetes: Secondary | ICD-10-CM | POA: Diagnosis not present

## 2022-08-17 DIAGNOSIS — E782 Mixed hyperlipidemia: Secondary | ICD-10-CM | POA: Diagnosis not present

## 2022-08-22 DIAGNOSIS — M4807 Spinal stenosis, lumbosacral region: Secondary | ICD-10-CM | POA: Diagnosis not present

## 2022-08-22 DIAGNOSIS — M6281 Muscle weakness (generalized): Secondary | ICD-10-CM | POA: Diagnosis not present

## 2022-08-22 DIAGNOSIS — E875 Hyperkalemia: Secondary | ICD-10-CM | POA: Diagnosis not present

## 2022-08-22 DIAGNOSIS — E782 Mixed hyperlipidemia: Secondary | ICD-10-CM | POA: Diagnosis not present

## 2022-08-22 DIAGNOSIS — I10 Essential (primary) hypertension: Secondary | ICD-10-CM | POA: Diagnosis not present

## 2022-08-22 DIAGNOSIS — I700 Atherosclerosis of aorta: Secondary | ICD-10-CM | POA: Diagnosis not present

## 2022-08-22 DIAGNOSIS — E871 Hypo-osmolality and hyponatremia: Secondary | ICD-10-CM | POA: Diagnosis not present

## 2022-08-22 DIAGNOSIS — R748 Abnormal levels of other serum enzymes: Secondary | ICD-10-CM | POA: Insufficient documentation

## 2022-08-22 DIAGNOSIS — H40059 Ocular hypertension, unspecified eye: Secondary | ICD-10-CM | POA: Diagnosis not present

## 2022-08-22 DIAGNOSIS — R809 Proteinuria, unspecified: Secondary | ICD-10-CM | POA: Insufficient documentation

## 2022-08-22 DIAGNOSIS — R7303 Prediabetes: Secondary | ICD-10-CM | POA: Diagnosis not present

## 2022-09-06 DIAGNOSIS — E875 Hyperkalemia: Secondary | ICD-10-CM | POA: Diagnosis not present

## 2022-09-20 DIAGNOSIS — I10 Essential (primary) hypertension: Secondary | ICD-10-CM | POA: Diagnosis not present

## 2022-09-20 DIAGNOSIS — M4807 Spinal stenosis, lumbosacral region: Secondary | ICD-10-CM | POA: Diagnosis not present

## 2022-10-10 DIAGNOSIS — H04123 Dry eye syndrome of bilateral lacrimal glands: Secondary | ICD-10-CM | POA: Diagnosis not present

## 2022-10-11 DIAGNOSIS — M5416 Radiculopathy, lumbar region: Secondary | ICD-10-CM | POA: Diagnosis not present

## 2022-10-12 DIAGNOSIS — M4807 Spinal stenosis, lumbosacral region: Secondary | ICD-10-CM | POA: Diagnosis not present

## 2022-10-12 DIAGNOSIS — I10 Essential (primary) hypertension: Secondary | ICD-10-CM | POA: Diagnosis not present

## 2022-11-03 DIAGNOSIS — I10 Essential (primary) hypertension: Secondary | ICD-10-CM | POA: Diagnosis not present

## 2022-11-15 ENCOUNTER — Telehealth: Payer: Self-pay | Admitting: Internal Medicine

## 2022-11-15 NOTE — Telephone Encounter (Signed)
Needs new patient approval

## 2022-11-16 NOTE — Telephone Encounter (Signed)
scheduled

## 2022-12-16 DIAGNOSIS — M1990 Unspecified osteoarthritis, unspecified site: Secondary | ICD-10-CM | POA: Diagnosis not present

## 2022-12-16 DIAGNOSIS — M4800 Spinal stenosis, site unspecified: Secondary | ICD-10-CM | POA: Diagnosis not present

## 2022-12-16 DIAGNOSIS — I129 Hypertensive chronic kidney disease with stage 1 through stage 4 chronic kidney disease, or unspecified chronic kidney disease: Secondary | ICD-10-CM | POA: Diagnosis not present

## 2022-12-16 DIAGNOSIS — M5440 Lumbago with sciatica, unspecified side: Secondary | ICD-10-CM | POA: Diagnosis not present

## 2022-12-16 DIAGNOSIS — E785 Hyperlipidemia, unspecified: Secondary | ICD-10-CM | POA: Diagnosis not present

## 2022-12-16 DIAGNOSIS — H409 Unspecified glaucoma: Secondary | ICD-10-CM | POA: Diagnosis not present

## 2022-12-16 DIAGNOSIS — R32 Unspecified urinary incontinence: Secondary | ICD-10-CM | POA: Diagnosis not present

## 2022-12-16 DIAGNOSIS — G629 Polyneuropathy, unspecified: Secondary | ICD-10-CM | POA: Diagnosis not present

## 2022-12-16 DIAGNOSIS — H3530 Unspecified macular degeneration: Secondary | ICD-10-CM | POA: Diagnosis not present

## 2022-12-29 ENCOUNTER — Ambulatory Visit: Payer: Medicare PPO | Admitting: Internal Medicine

## 2023-01-03 ENCOUNTER — Encounter: Payer: Self-pay | Admitting: Internal Medicine

## 2023-01-03 ENCOUNTER — Ambulatory Visit: Payer: Medicare PPO | Admitting: Internal Medicine

## 2023-01-03 VITALS — BP 186/76 | HR 71 | Ht 61.0 in | Wt 116.0 lb

## 2023-01-03 DIAGNOSIS — I10 Essential (primary) hypertension: Secondary | ICD-10-CM | POA: Diagnosis not present

## 2023-01-03 DIAGNOSIS — Z1321 Encounter for screening for nutritional disorder: Secondary | ICD-10-CM

## 2023-01-03 DIAGNOSIS — M4807 Spinal stenosis, lumbosacral region: Secondary | ICD-10-CM | POA: Diagnosis not present

## 2023-01-03 DIAGNOSIS — E782 Mixed hyperlipidemia: Secondary | ICD-10-CM | POA: Diagnosis not present

## 2023-01-03 MED ORDER — AMLODIPINE BESYLATE 5 MG PO TABS: 5.0000 mg | ORAL_TABLET | Freq: Every day | ORAL | 2 refills | Status: DC

## 2023-01-03 MED ORDER — CARVEDILOL 6.25 MG PO TABS: 6.2500 mg | ORAL_TABLET | Freq: Two times a day (BID) | ORAL | 3 refills | Status: DC

## 2023-01-03 NOTE — Progress Notes (Signed)
 New Patient Office Visit  Subjective    Patient ID: Theresa Morris, female    DOB: April 07, 1931  Age: 89 y.o. MRN: [id number]  CC:  Chief Complaint  Patient presents with   Establish Care    HPI [name] presents to establish care.  She is an 89 year old woman who endorses a past medical history significant for HTN, HLD, chronic back pain, and prediabetes.  Previously followed by Zack Hall, MD. [name] reports feeling well today.  Her acute concern is poorly controlled hypertension.  She is currently prescribed amlodipine  2.5 mg daily and losartan  100 mg daily.  She recognizes the need to improve HTN control.  She is otherwise asymptomatic.  She denies a history of tobacco, alcohol, and illicit drug use.  She is unaware of any significant family medical history.  Acute concerns, chronic medical conditions, and outstanding preventative care items discussed today are individually addressed in A/P below.   Outpatient Encounter Medications as of 01/03/2023  Medication Sig   amLODipine  (NORVASC ) 5 MG tablet Take 1 tablet (5 mg total) by mouth daily.   carvedilol  (COREG ) 6.25 MG tablet Take 1 tablet (6.25 mg total) by mouth 2 (two) times daily with a meal.   dorzolamide -timolol  (COSOPT ) 22.3-6.8 MG/ML ophthalmic solution Place 1 drop into both eyes 2 (two) times daily.   gabapentin (NEURONTIN) 100 MG capsule Take 100 mg by mouth 3 (three) times daily.   losartan  (COZAAR ) 100 MG tablet Take 1 tablet by mouth daily.   meloxicam (MOBIC) 15 MG tablet Take 15 mg by mouth daily.   rosuvastatin (CRESTOR) 5 MG tablet Take 5 mg by mouth daily.   [DISCONTINUED] amLODipine  (NORVASC ) 2.5 MG tablet Take 1 tablet by mouth at bedtime.   Naftifine HCl (NAFTIN) 2 % GEL APPLY AS A THIN LAYER TO THE AFFECTED AREA(S) PLUS A 0.5 INCH MARGIN OF HEALTHY SURROUNDING SKIN BY TOPICAL ROUTE ONCE DAILY FOR 2 WEEKS (Patient not taking: Reported on 01/03/2023)   [DISCONTINUED] amoxicillin -clavulanate  (AUGMENTIN ) 875-125 MG tablet Take 1 tablet by mouth every 12 (twelve) hours.   [DISCONTINUED] furosemide (LASIX) 20 MG tablet Take 20 mg by mouth daily.   [DISCONTINUED] hydrochlorothiazide  (HYDRODIURIL ) 12.5 MG tablet Take 12.5 mg by mouth daily.   [DISCONTINUED] losartan  (COZAAR ) 25 MG tablet Take 1 tablet (25 mg total) by mouth daily.   [DISCONTINUED] pravastatin (PRAVACHOL) 40 MG tablet Take 40 mg by mouth daily.   No facility-administered encounter medications on file as of 01/03/2023.    Past Medical History:  Diagnosis Date   Arthritis    Complication of anesthesia    was told she had a small airway   Glaucoma    Hyperlipemia    Hypertension    TIA (transient ischemic attack)    had a finger go limp-was told tia.    Past Surgical History:  Procedure Laterality Date   CESAREAN SECTION     EYE SURGERY  2009   rt eye   TONSILLECTOMY     TRIGGER FINGER RELEASE  09/27/2011   Procedure: RELEASE TRIGGER FINGER/A-1 PULLEY;  Surgeon: Arley JONELLE Curia, MD;  Location: [facility];  Service: Orthopedics;  Laterality: Left;  Left middle finger    History reviewed. No pertinent family history.  Social History   Socioeconomic History   Marital status: Married    Spouse name: Not on file   Number of children: Not on file   Years of education: Not on file   Highest education level:  Not on file  Occupational History   Not on file  Tobacco Use   Smoking status: Never   Smokeless tobacco: Never  Substance and Sexual Activity   Alcohol use: No   Drug use: No   Sexual activity: Not on file  Other Topics Concern   Not on file  Social History Narrative   Not on file   Social Drivers of Health   Financial Resource Strain: Not on file  Food Insecurity: Not on file  Transportation Needs: Not on file  Physical Activity: Not on file  Stress: Not on file  Social Connections: Not on file  Intimate Partner Violence: Not on file   Review of Systems  Constitutional:   Negative for chills and fever.  HENT:  Negative for sore throat.   Respiratory:  Negative for cough and shortness of breath.   Cardiovascular:  Negative for chest pain, palpitations and leg swelling.  Gastrointestinal:  Negative for abdominal pain, blood in stool, constipation, diarrhea, nausea and vomiting.  Genitourinary:  Negative for dysuria and hematuria.  Musculoskeletal:  Negative for myalgias.  Skin:  Negative for itching and rash.  Neurological:  Negative for dizziness and headaches.  Psychiatric/Behavioral:  Negative for depression and suicidal ideas.    Objective    BP (!) 186/76   Pulse 71   Ht 5' 1 (1.549 m)   Wt 116 lb (52.6 kg)   SpO2 96%   BMI 21.92 kg/m   Physical Exam Vitals reviewed.  Constitutional:      General: She is not in acute distress.    Appearance: Normal appearance. She is not toxic-appearing.  HENT:     Head: Normocephalic and atraumatic.     Right Ear: External ear normal.     Left Ear: External ear normal.     Nose: Nose normal. No congestion or rhinorrhea.     Mouth/Throat:     Mouth: Mucous membranes are moist.     Pharynx: Oropharynx is clear. No oropharyngeal exudate or posterior oropharyngeal erythema.  Eyes:     General: No scleral icterus.    Extraocular Movements: Extraocular movements intact.     Conjunctiva/sclera: Conjunctivae normal.     Pupils: Pupils are equal, round, and reactive to light.  Cardiovascular:     Rate and Rhythm: Normal rate and regular rhythm.     Pulses: Normal pulses.     Heart sounds: Normal heart sounds. No murmur heard.    No friction rub. No gallop.  Pulmonary:     Effort: Pulmonary effort is normal.     Breath sounds: Normal breath sounds. No wheezing, rhonchi or rales.  Abdominal:     General: Abdomen is flat. Bowel sounds are normal. There is no distension.     Palpations: Abdomen is soft.     Tenderness: There is no abdominal tenderness.  Musculoskeletal:        General: No swelling. Normal  range of motion.     Cervical back: Normal range of motion.     Right lower leg: No edema.     Left lower leg: No edema.  Lymphadenopathy:     Cervical: No cervical adenopathy.  Skin:    General: Skin is warm and dry.     Capillary Refill: Capillary refill takes less than 2 seconds.     Coloration: Skin is not jaundiced.  Neurological:     General: No focal deficit present.     Mental Status: She is alert and oriented to person, place,  and time.  Psychiatric:        Mood and Affect: Mood normal.        Behavior: Behavior normal.    Assessment & Plan:   Problem List Items Addressed This Visit       Essential hypertension - Primary   Her acute concern today is poorly controlled HTN.  BP is significantly elevated at 186/76.  She is currently prescribed amlodipine  2.5 mg daily and losartan  100 mg daily.  Previously intolerant of HCTZ. -Increase amlodipine  to 5 mg daily -Add carvedilol  6.25 mg twice daily -Continue losartan  as currently prescribed -Follow-up in 4 to 6 weeks for HTN check      Mixed hyperlipidemia   Currently prescribed rosuvastatin 5 mg daily.  Repeat lipid panel ordered today.      Spinal stenosis of lumbosacral region   She endorses a history of chronic lumbar back pain secondary to lumbar spinal stenosis radiculopathy.  She takes meloxicam nightly and gabapentin as needed for pain relief.  Followed by [facility].       Return in about 4 weeks (around 01/31/2023) for HTN.   Manus FORBES Fireman, MD

## 2023-01-03 NOTE — Patient Instructions (Signed)
 It was a pleasure to see you today.  Thank you for giving us  the opportunity to be involved in your care.  Below is a brief recap of your visit and next steps.  We will plan to see you again in 4-6 weeks.  Summary You have established care today Increase amlodipine  to 5 mg daily and start carvedilol  6.25 mg twice daily We will check basic labs Follow up in 4-6 weeks for BP check

## 2023-01-04 LAB — CMP14+EGFR
ALT: 12 [IU]/L (ref 0–32)
AST: 22 [IU]/L (ref 0–40)
Albumin: 4.4 g/dL (ref 3.7–4.7)
Alkaline Phosphatase: 132 [IU]/L — ABNORMAL HIGH (ref 44–121)
BUN/Creatinine Ratio: 16 (ref 12–28)
BUN: 15 mg/dL (ref 8–27)
Bilirubin Total: 0.4 mg/dL (ref 0.0–1.2)
CO2: 24 mmol/L (ref 20–29)
Calcium: 9.4 mg/dL (ref 8.7–10.3)
Chloride: 104 mmol/L (ref 96–106)
Creatinine, Ser: 0.91 mg/dL (ref 0.57–1.00)
Globulin, Total: 2 g/dL (ref 1.5–4.5)
Glucose: 106 mg/dL — ABNORMAL HIGH (ref 70–99)
Potassium: 4.7 mmol/L (ref 3.5–5.2)
Sodium: 141 mmol/L (ref 134–144)
Total Protein: 6.4 g/dL (ref 6.0–8.5)
eGFR: 60 mL/min/{1.73_m2} (ref >59)

## 2023-01-04 LAB — CBC WITH DIFFERENTIAL/PLATELET
Basophils Absolute: 0.1 10*3/uL (ref 0.0–0.2)
Basos: 1 %
EOS (ABSOLUTE): 0.4 10*3/uL (ref 0.0–0.4)
Eos: 4 %
Hematocrit: 36.7 % (ref 34.0–46.6)
Hemoglobin: 12.3 g/dL (ref 11.1–15.9)
Immature Grans (Abs): 0 10*3/uL (ref 0.0–0.1)
Immature Granulocytes: 0 %
Lymphocytes Absolute: 2.5 10*3/uL (ref 0.7–3.1)
Lymphs: 27 %
MCH: 30.5 pg (ref 26.6–33.0)
MCHC: 33.5 g/dL (ref 31.5–35.7)
MCV: 91 fL (ref 79–97)
Monocytes Absolute: 0.9 10*3/uL (ref 0.1–0.9)
Monocytes: 9 %
Neutrophils Absolute: 5.4 10*3/uL (ref 1.4–7.0)
Neutrophils: 59 %
Platelets: 314 10*3/uL (ref 150–450)
RBC: 4.03 x10E6/uL (ref 3.77–5.28)
RDW: 12.7 % (ref 11.7–15.4)
WBC: 9.3 10*3/uL (ref 3.4–10.8)

## 2023-01-04 LAB — LIPID PANEL
Chol/HDL Ratio: 2.3 {ratio} (ref 0.0–4.4)
Cholesterol, Total: 177 mg/dL (ref 100–199)
HDL: 76 mg/dL (ref >39)
LDL Chol Calc (NIH): 89 mg/dL (ref 0–99)
Triglycerides: 60 mg/dL (ref 0–149)
VLDL Cholesterol Cal: 12 mg/dL (ref 5–40)

## 2023-01-04 LAB — VITAMIN D 25 HYDROXY (VIT D DEFICIENCY, FRACTURES): Vit D, 25-Hydroxy: 15.9 ng/mL — ABNORMAL LOW (ref 30.0–100.0)

## 2023-01-05 ENCOUNTER — Other Ambulatory Visit: Payer: Self-pay | Admitting: Internal Medicine

## 2023-01-05 DIAGNOSIS — E559 Vitamin D deficiency, unspecified: Secondary | ICD-10-CM

## 2023-01-05 MED ORDER — VITAMIN D (ERGOCALCIFEROL) 1.25 MG (50000 UNIT) PO CAPS: 50000.0000 [IU] | ORAL_CAPSULE | ORAL | 0 refills | Status: AC

## 2023-01-15 NOTE — Assessment & Plan Note (Addendum)
 Her acute concern today is poorly controlled HTN.  BP is significantly elevated at 186/76.  She is currently prescribed amlodipine  2.5 mg daily and losartan  100 mg daily.  Previously intolerant of HCTZ. -Increase amlodipine  to 5 mg daily -Add carvedilol  6.25 mg twice daily -Continue losartan  as currently prescribed -Follow-up in 4 to 6 weeks for HTN check

## 2023-01-15 NOTE — Assessment & Plan Note (Signed)
Currently prescribed rosuvastatin 5 mg daily.  Repeat lipid panel ordered today. 

## 2023-01-15 NOTE — Assessment & Plan Note (Signed)
 She endorses a history of chronic lumbar back pain secondary to lumbar spinal stenosis radiculopathy.  She takes meloxicam nightly and gabapentin as needed for pain relief.  Followed by [facility].

## 2023-01-19 DIAGNOSIS — X32XXXA Exposure to sunlight, initial encounter: Secondary | ICD-10-CM | POA: Diagnosis not present

## 2023-01-19 DIAGNOSIS — D225 Melanocytic nevi of trunk: Secondary | ICD-10-CM | POA: Diagnosis not present

## 2023-01-19 DIAGNOSIS — L570 Actinic keratosis: Secondary | ICD-10-CM | POA: Diagnosis not present

## 2023-01-19 DIAGNOSIS — L820 Inflamed seborrheic keratosis: Secondary | ICD-10-CM | POA: Diagnosis not present

## 2023-02-07 ENCOUNTER — Encounter: Payer: Self-pay | Admitting: Internal Medicine

## 2023-02-07 ENCOUNTER — Ambulatory Visit: Payer: Medicare PPO | Admitting: Internal Medicine

## 2023-02-07 VITALS — BP 138/58 | HR 66 | Ht 61.0 in | Wt 116.6 lb

## 2023-02-07 DIAGNOSIS — I10 Essential (primary) hypertension: Secondary | ICD-10-CM | POA: Diagnosis not present

## 2023-02-07 DIAGNOSIS — E559 Vitamin D deficiency, unspecified: Secondary | ICD-10-CM

## 2023-02-07 NOTE — Assessment & Plan Note (Signed)
 Presenting today for HTN follow-up.  Amlodipine  was increased to 5 mg daily and carvedilol  6.25 mg twice daily started at her last appointment.  She is additionally prescribed losartan  100 mg daily.  BP has improved, 149/74 initially and 138/58 on repeat.  No further medication changes are indicated this time.  We will tentatively plan for follow-up in 3 months.

## 2023-02-07 NOTE — Assessment & Plan Note (Signed)
Noted on recent labs.  She has started high-dose, weekly vitamin D supplementation.  Repeat vitamin D level upon completion of weekly vitamin D supplement.

## 2023-02-07 NOTE — Patient Instructions (Signed)
 It was a pleasure to see you today.  Thank you for giving us  the opportunity to be involved in your care.  Below is a brief recap of your visit and next steps.  We will plan to see you again in 3 months.  Summary No medication changes today Blood pressure has improved Follow up in 3 months

## 2023-02-07 NOTE — Progress Notes (Signed)
 Established Patient Office Visit  Subjective   Patient ID: Theresa Morris, female    DOB: Mar 12, 1931  Age: 89 y.o. MRN: [id number]  Chief Complaint  Patient presents with   Hypertension    Four week follow up    Theresa Morris returns to care today for HTN follow-up.  She was last evaluated by me in late December 2024 as a new patient presenting to establish care.  Her blood pressure was significantly elevated at that time.  Amlodipine  was increased to 5 mg daily and carvedilol  6.25 mg twice daily was added.  4-week follow-up was arranged for BP check.  There have been no acute interval events.  Theresa Morris reports feeling well today.  She is asymptomatic and has no acute concerns discussed.  She has been taking her antihypertensive medications as prescribed and reports that her home blood pressure readings have improved.  Past Medical History:  Diagnosis Date   Arthritis    Complication of anesthesia    was told she had a small airway   Glaucoma    Hyperlipemia    Hypertension    TIA (transient ischemic attack)    had a finger go limp-was told tia.   Past Surgical History:  Procedure Laterality Date   CESAREAN SECTION     EYE SURGERY  2009   rt eye   TONSILLECTOMY     TRIGGER FINGER RELEASE  09/27/2011   Procedure: RELEASE TRIGGER FINGER/A-1 PULLEY;  Surgeon: Arley JONELLE Curia, MD;  Location: [facility];  Service: Orthopedics;  Laterality: Left;  Left middle finger   Social History   Tobacco Use   Smoking status: Never   Smokeless tobacco: Never  Substance Use Topics   Alcohol use: No   Drug use: No   History reviewed. No pertinent family history. No Known Allergies  Review of Systems  Constitutional:  Negative for chills and fever.  HENT:  Negative for sore throat.   Respiratory:  Negative for cough and shortness of breath.   Cardiovascular:  Negative for chest pain, palpitations and leg swelling.  Gastrointestinal:  Negative for abdominal pain,  blood in stool, constipation, diarrhea, nausea and vomiting.  Genitourinary:  Negative for dysuria and hematuria.  Musculoskeletal:  Negative for myalgias.  Skin:  Negative for itching and rash.  Neurological:  Negative for dizziness and headaches.  Psychiatric/Behavioral:  Negative for depression and suicidal ideas.      Objective:     BP (!) 138/58   Pulse 66   Ht 5' 1 (1.549 m)   Wt 116 lb 9.6 oz (52.9 kg)   SpO2 96%   BMI 22.03 kg/m  BP Readings from Last 3 Encounters:  02/07/23 (!) 138/58  01/03/23 (!) 186/76  07/05/19 119/67   Physical Exam Vitals reviewed.  Constitutional:      General: She is not in acute distress.    Appearance: Normal appearance. She is not toxic-appearing.  HENT:     Head: Normocephalic and atraumatic.     Right Ear: External ear normal.     Left Ear: External ear normal.     Nose: Nose normal. No congestion or rhinorrhea.     Mouth/Throat:     Mouth: Mucous membranes are moist.     Pharynx: Oropharynx is clear. No oropharyngeal exudate or posterior oropharyngeal erythema.  Eyes:     General: No scleral icterus.    Extraocular Movements: Extraocular movements intact.     Conjunctiva/sclera: Conjunctivae normal.  Pupils: Pupils are equal, round, and reactive to light.  Cardiovascular:     Rate and Rhythm: Normal rate and regular rhythm.     Pulses: Normal pulses.     Heart sounds: Normal heart sounds. No murmur heard.    No friction rub. No gallop.  Pulmonary:     Effort: Pulmonary effort is normal.     Breath sounds: Normal breath sounds. No wheezing, rhonchi or rales.  Abdominal:     General: Abdomen is flat. Bowel sounds are normal. There is no distension.     Palpations: Abdomen is soft.     Tenderness: There is no abdominal tenderness.  Musculoskeletal:        General: No swelling. Normal range of motion.     Cervical back: Normal range of motion.     Right lower leg: No edema.     Left lower leg: No edema.   Lymphadenopathy:     Cervical: No cervical adenopathy.  Skin:    General: Skin is warm and dry.     Capillary Refill: Capillary refill takes less than 2 seconds.     Coloration: Skin is not jaundiced.  Neurological:     General: No focal deficit present.     Mental Status: She is alert and oriented to person, place, and time.  Psychiatric:        Mood and Affect: Mood normal.        Behavior: Behavior normal.   Last CBC Lab Results  Component Value Date   WBC 9.3 01/03/2023   HGB 12.3 01/03/2023   HCT 36.7 01/03/2023   MCV 91 01/03/2023   MCH 30.5 01/03/2023   RDW 12.7 01/03/2023   PLT 314 01/03/2023   Last metabolic panel Lab Results  Component Value Date   GLUCOSE 106 (H) 01/03/2023   NA 141 01/03/2023   K 4.7 01/03/2023   CL 104 01/03/2023   CO2 24 01/03/2023   BUN 15 01/03/2023   CREATININE 0.91 01/03/2023   EGFR 60 01/03/2023   CALCIUM 9.4 01/03/2023   PROT 6.4 01/03/2023   ALBUMIN 4.4 01/03/2023   LABGLOB 2.0 01/03/2023   BILITOT 0.4 01/03/2023   ALKPHOS 132 (H) 01/03/2023   AST 22 01/03/2023   ALT 12 01/03/2023   ANIONGAP 6 09/06/2017   Last lipids Lab Results  Component Value Date   CHOL 177 01/03/2023   HDL 76 01/03/2023   LDLCALC 89 01/03/2023   TRIG 60 01/03/2023   CHOLHDL 2.3 01/03/2023   Last vitamin D  Lab Results  Component Value Date   VD25OH 15.9 (L) 01/03/2023     Assessment & Plan:   Problem List Items Addressed This Visit       Essential hypertension - Primary   Presenting today for HTN follow-up.  Amlodipine  was increased to 5 mg daily and carvedilol  6.25 mg twice daily started at her last appointment.  She is additionally prescribed losartan  100 mg daily.  BP has improved, 149/74 initially and 138/58 on repeat.  No further medication changes are indicated this time.  We will tentatively plan for follow-up in 3 months.      Vitamin D  deficiency   Noted on recent labs.  She has started high-dose, weekly vitamin D   supplementation.  Repeat vitamin D  level upon completion of weekly vitamin D  supplement.       Return in about 3 months (around 05/07/2023).    Manus FORBES Fireman, MD

## 2023-03-16 DIAGNOSIS — E559 Vitamin D deficiency, unspecified: Secondary | ICD-10-CM | POA: Diagnosis not present

## 2023-03-17 ENCOUNTER — Encounter: Payer: Self-pay | Admitting: Internal Medicine

## 2023-03-17 ENCOUNTER — Other Ambulatory Visit: Payer: Self-pay

## 2023-03-17 DIAGNOSIS — I10 Essential (primary) hypertension: Secondary | ICD-10-CM

## 2023-03-17 LAB — VITAMIN D 25 HYDROXY (VIT D DEFICIENCY, FRACTURES): Vit D, 25-Hydroxy: 40.4 ng/mL (ref 30.0–100.0)

## 2023-03-17 MED ORDER — CARVEDILOL 6.25 MG PO TABS: 6.2500 mg | ORAL_TABLET | Freq: Two times a day (BID) | ORAL | 0 refills | Status: DC

## 2023-03-17 NOTE — Telephone Encounter (Signed)
 90day supply sent.

## 2023-03-27 ENCOUNTER — Ambulatory Visit: Payer: Self-pay | Admitting: Internal Medicine

## 2023-03-27 NOTE — Telephone Encounter (Signed)
 Copied from CRM [id number]. Topic: Clinical - Red Word Triage >> Mar 27, 2023  9:05 AM Gildardo Pounds wrote: Red Word that prompted transfer to Nurse Triage: April, daughter, patient chronic diarrhea for a few weeks and left foot swelling on and off. Callback number is [phone]   Chief Complaint: diarrhea Symptoms: watery diarrhea Frequency: 4 x in 24 hours Pertinent Negatives: Patient denies abd pain and nausea Disposition: [] ED /[] Urgent Care (no appt availability in office) / [x] Appointment(In office/virtual)/ []  [facility]/ [] Home Care/ [] Refused Recommended Disposition /[] [facility]/ []  Follow-up with PCP Additional Notes: Per daughter pt has been having diarrhea for several weeks off/on. Had some relief at one poing with OTC immodium, but she stated it made her constipated so she doesn't take it. Per daughter pt does not reports any nausea or abd pain and has been eating and drinking normally. Appt scheduled with Dr. Lodema Hong for 3/25  Reason for Disposition  Diarrhea is a chronic symptom (recurrent or ongoing AND present > 4 weeks)  Answer Assessment - Initial Assessment Questions 1. DIARRHEA SEVERITY: "How bad is the diarrhea?" "How many more stools have you had in the past 24 hours than normal?"    - NO DIARRHEA (SCALE 0)   - MILD (SCALE 1-3): Few loose or mushy BMs; increase of 1-3 stools over normal daily number of stools; mild increase in ostomy output.   -  MODERATE (SCALE 4-7): Increase of 4-6 stools daily over normal; moderate increase in ostomy output.   -  SEVERE (SCALE 8-10; OR "WORST POSSIBLE"): Increase of 7 or more stools daily over normal; moderate increase in ostomy output; incontinence.     4 stools in last 24 hours  2. ONSET: "When did the diarrhea begin?"      Several weeks  3. BM CONSISTENCY: "How loose or watery is the diarrhea?"      Watery, has been running down leg  4. VOMITING: "Are you also vomiting?" If Yes, ask: "How many times in the  past 24 hours?"      No  5. ABDOMEN PAIN: "Are you having any abdomen pain?" If Yes, ask: "What does it feel like?" (e.g., crampy, dull, intermittent, constant)      No  6. ABDOMEN PAIN SEVERITY: If present, ask: "How bad is the pain?"  (e.g., Scale 1-10; mild, moderate, or severe)   - MILD (1-3): doesn't interfere with normal activities, abdomen soft and not tender to touch    - MODERATE (4-7): interferes with normal activities or awakens from sleep, abdomen tender to touch    - SEVERE (8-10): excruciating pain, doubled over, unable to do any normal activities       No  7. ORAL INTAKE: If vomiting, "Have you been able to drink liquids?" "How much liquids have you had in the past 24 hours?"     Yes, eating and drinking okay  8. HYDRATION: "Any signs of dehydration?" (e.g., dry mouth [not just dry lips], too weak to stand, dizziness, new weight loss) "When did you last urinate?"     No  9. EXPOSURE: "Have you traveled to a foreign country recently?" "Have you been exposed to anyone with diarrhea?" "Could you have eaten any food that was spoiled?"     No  10. ANTIBIOTIC USE: "Are you taking antibiotics now or have you taken antibiotics in the past 2 months?"       No  11. OTHER SYMPTOMS: "Do you have any other symptoms?" (e.g., fever,  blood in stool)       No  Protocols used: Diarrhea-A-AH

## 2023-03-28 ENCOUNTER — Encounter: Payer: Self-pay | Admitting: Family Medicine

## 2023-03-28 ENCOUNTER — Ambulatory Visit: Payer: Self-pay | Admitting: Family Medicine

## 2023-03-28 VITALS — BP 148/82 | HR 70 | Ht 61.0 in | Wt 116.1 lb

## 2023-03-28 DIAGNOSIS — R194 Change in bowel habit: Secondary | ICD-10-CM | POA: Insufficient documentation

## 2023-03-28 DIAGNOSIS — R600 Localized edema: Secondary | ICD-10-CM

## 2023-03-28 DIAGNOSIS — I10 Essential (primary) hypertension: Secondary | ICD-10-CM

## 2023-03-28 MED ORDER — CARVEDILOL 6.25 MG PO TABS: 6.2500 mg | ORAL_TABLET | Freq: Two times a day (BID) | ORAL | 0 refills | Status: DC

## 2023-03-28 NOTE — Assessment & Plan Note (Signed)
 3 month h/o change in stool habits , blow outs frequently x at least once per week, has started

## 2023-03-28 NOTE — Patient Instructions (Addendum)
 F/u with pCP as before   No change in BP med today  You are referred to Dr Jena Gauss re GI concern  Thanks for choosing [facility], we consider it a privelige to serve you. Pls do use the cane for protection!  Compression hose and leg elevation will reduce swelling   Thanks for choosing [facility], we consider it a privelige to serve you.

## 2023-03-29 ENCOUNTER — Encounter: Payer: Self-pay | Admitting: Family Medicine

## 2023-03-29 NOTE — Assessment & Plan Note (Signed)
 Adequately controlled on current regime, PCP to continue management as deemed appropriate, no med change at this visit

## 2023-03-29 NOTE — Assessment & Plan Note (Signed)
 Left pedal edema noted, recommend leg elevation and wearing compression hose

## 2023-03-29 NOTE — Progress Notes (Signed)
   Theresa Morris     MRN: [id number]      DOB: August 12, 1931  Chief Complaint  Patient presents with   Acute Visit    Episodes of Diarrhea for over several weeks may be due to dietary reason  Leg pain and swelling     HPI Theresa Morris is here with a 3 month c/o disabling inconvenient loose stool, to the extent that she is  afraid to eat when she goes out at times as never sure if she will have a "blow out".denies new abdominal pain, blood in stool or black stool, no nausea or vomiting Never had a colonoscopy nor any routine cancer screens and overall healthy and functional Has concerns of uncontrolled HTN despite recent increase in med dose, daughter who is with her refers to goal of less than 120/80, however I did advise there is some adjustment in goal related to age and ability to tolerate the medication Has log of home BP readings which correlate well with reading in the office C/o left  foot and ankle swelling , , next to none on right, not a new problem, swelling goes down overnight and is aggravated by weight bearing and sitting Denies PND  or orthopnea  ROS Denies recent fever or chills. Denies sinus pressure, nasal congestion, ear pain or sore throat. Denies chest congestion, productive cough or wheezing. Denies chest pains, palpitations on.   Denies dysuria, frequency, hesitancy or incontinence. Chronic  joint pain, and some limitation in mobility.   PE  BP (!) 148/82   Pulse 70   Ht 5\' 1"  (1.549 m)   Wt 116 lb 1.3 oz (52.7 kg)   SpO2 94%   BMI 21.93 kg/m   Patient alert and oriented and in no cardiopulmonary distress.  HEENT: No facial asymmetry, EOMI,     Neck supple .  Chest: Clear to auscultation bilaterally.  CVS: S1, S2 no murmurs, no S3.Regular rate.  ABD: Soft non tender.   Ext: trace pedal edema on left foot, none on right  MS: swelling and deformity of left ankle Skin: Intact, no ulcerations or rash noted.  Psych: Good eye contact, normal  affect. Memory intact not anxious or depressed appearing.  CNS: CN 2-12 intact, power,  normal throughout.no focal deficits noted.   Assessment & Plan  Change in stool habits 3 month h/o change in stool habits , blow outs frequently x at least once per week, has started   Edema of lower extremity Left pedal edema noted, recommend leg elevation and wearing compression hose  Essential hypertension Adequately controlled on current regime, PCP to continue management as deemed appropriate, no med change at this visit

## 2023-03-30 ENCOUNTER — Encounter: Payer: Self-pay | Admitting: *Deleted

## 2023-04-10 ENCOUNTER — Ambulatory Visit: Admitting: Gastroenterology

## 2023-04-10 ENCOUNTER — Ambulatory Visit (HOSPITAL_COMMUNITY)
Admission: RE | Admit: 2023-04-10 | Discharge: 2023-04-10 | Disposition: A | Discharge status: Home / Self Care | Source: Ambulatory Visit | Attending: Gastroenterology | Admitting: Gastroenterology

## 2023-04-10 ENCOUNTER — Encounter: Payer: Self-pay | Admitting: Gastroenterology

## 2023-04-10 VITALS — BP 151/65 | HR 63 | Temp 98.4°F | Ht 61.0 in | Wt 119.2 lb

## 2023-04-10 DIAGNOSIS — K5900 Constipation, unspecified: Secondary | ICD-10-CM | POA: Diagnosis not present

## 2023-04-10 DIAGNOSIS — R197 Diarrhea, unspecified: Secondary | ICD-10-CM

## 2023-04-10 DIAGNOSIS — K5909 Other constipation: Secondary | ICD-10-CM

## 2023-04-10 DIAGNOSIS — R194 Change in bowel habit: Secondary | ICD-10-CM | POA: Diagnosis not present

## 2023-04-10 DIAGNOSIS — R152 Fecal urgency: Secondary | ICD-10-CM | POA: Diagnosis not present

## 2023-04-10 DIAGNOSIS — I700 Atherosclerosis of aorta: Secondary | ICD-10-CM | POA: Diagnosis not present

## 2023-04-10 NOTE — Patient Instructions (Addendum)
 Please have stool studies completed at [facility].  We will call you with results once they have been received. Please allow 3-5 business days for review. 2 locations for [facility] in [place]:              1. [address], [place]              2. [address] [place], [place]   Please follow a low-fat diet.  I have ordered an abdominal x-ray for you, you can go at any point to any point to have this done, you do not need an appointment.  This is to make sure that the diarrhea you are having is not overflow in the setting of possible constipation.  I have attached some information to the back of your paperwork today regarding pancreatic insufficiency.  If your stool testing reveals low levels, we can do a trial of pancreatic enzyme therapy.  Happy Early [place] Ouch!!  It was a pleasure to see you today. I want to create trusting relationships with patients. If you receive a survey regarding your visit,  I greatly appreciate you taking time to fill this out on paper or through your MyChart. I value your feedback.  Brooke Bonito, MSN, FNP-BC, AGACNP-BC [facility]

## 2023-04-10 NOTE — Progress Notes (Unsigned)
 GI Office Note    Referring Provider: Kerri Perches, MD Primary Care Physician:  Billie Lade, MD  Primary Gastroenterologist: Hennie Duos. Marletta Lor, DO  Chief Complaint   Chief Complaint  Patient presents with   New Patient (Initial Visit)    Pt has had diarrhea  off and on recently. No abd pain. Salads make it worse and some junk food   History of Present Illness   Theresa Morris is a 89 y.o. female presenting today at the request of Kerri Perches, MD for change in stool habits.  Per review of chart, seen PCP 03/28/2023.  She reported 3 months of inconvenient loose stools to the extent where she is afraid to eat when she goes out as she is unsure when she will have a "blow out".  Denied abdominal pain, BRBPR, melena, nausea, or vomiting.  Has never had any colon cancer screening or any other routine cancer screenings.  She reported blackouts are happening at least once per week.  Given GI referral.  Today:  Stools are not watery. Usually occurring after she eats food. It come very quickly usually. Very urgent. Is not every single meal. - it is occurring more wit salads and junk food and she feels like she at times may binge eat and that sometimes may cause it given the combination of foods. She reports that it is blowout and she reports food and water in there. No abdominal pain.   Wipes hard and has hygiene OCD.   Never had colonoscopy and does not want one. Reports she has not been eating well..  Months ago she had a UTI and some spinal stenosis. Was just not hungry witih her UTI - thought of food didn't want her to eat. Had some weight loss during this time. Currently appetite is back to normal.   One good (nutritious) meal a day and snacks at night.  Used to not care if she had much protein but recently. Does eat lots of vegetables. Does eat a lot of cheese and eats yogurts.   Her normal is more on the constipated side - no BM everyday.   Wt Readings from  Last 3 Encounters:  04/10/23 119 lb 3.2 oz (54.1 kg)  03/28/23 116 lb 1.3 oz (52.7 kg)  02/07/23 116 lb 9.6 oz (52.9 kg)    Current Outpatient Medications  Medication Sig Dispense Refill   amLODipine (NORVASC) 5 MG tablet Take 1 tablet (5 mg total) by mouth daily. 30 tablet 2   carvedilol (COREG) 6.25 MG tablet Take 1 tablet (6.25 mg total) by mouth 2 (two) times daily with a meal. 180 tablet 0   dorzolamide-timolol (COSOPT) 22.3-6.8 MG/ML ophthalmic solution Place 1 drop into both eyes 2 (two) times daily. 10 mL 2   gabapentin (NEURONTIN) 100 MG capsule Take 100 mg by mouth 3 (three) times daily.     losartan (COZAAR) 100 MG tablet Take 1 tablet by mouth daily.     meloxicam (MOBIC) 15 MG tablet Take 15 mg by mouth daily.     rosuvastatin (CRESTOR) 5 MG tablet Take 5 mg by mouth daily.     No current facility-administered medications for this visit.    Past Medical History:  Diagnosis Date   Arthritis    Complication of anesthesia    was told she had a small airway   Glaucoma    Hyperlipemia    Hypertension    TIA (transient ischemic attack)    had a  finger go limp-was told tia.    Past Surgical History:  Procedure Laterality Date   CESAREAN SECTION     EYE SURGERY  2009   rt eye   TONSILLECTOMY     TRIGGER FINGER RELEASE  09/27/2011   Procedure: RELEASE TRIGGER FINGER/A-1 PULLEY;  Surgeon: Nicki Reaper, MD;  Location: [facility];  Service: Orthopedics;  Laterality: Left;  Left middle finger    History reviewed. No pertinent family history.  Allergies as of 04/10/2023   (No Known Allergies)    Social History   Socioeconomic History   Marital status: Married    Spouse name: Not on file   Number of children: Not on file   Years of education: Not on file   Highest education level: Not on file  Occupational History   Not on file  Tobacco Use   Smoking status: Never   Smokeless tobacco: Never  Substance and Sexual Activity   Alcohol use: No    Drug use: No   Sexual activity: Not on file  Other Topics Concern   Not on file  Social History Narrative   Not on file   Social Drivers of Health   Financial Resource Strain: Not on file  Food Insecurity: Not on file  Transportation Needs: Not on file  Physical Activity: Not on file  Stress: Not on file  Social Connections: Not on file  Intimate Partner Violence: Not on file   Review of Systems   Gen: Denies any fever, chills, fatigue, weight loss, lack of appetite.  CV: Denies chest pain, heart palpitations, peripheral edema, syncope.  Resp: Denies shortness of breath at rest or with exertion. Denies wheezing or cough.  GI: see HPI GU : Denies urinary burning, urinary frequency, urinary hesitancy MS: Denies joint pain, muscle weakness, cramps, or limitation of movement.  Derm: Denies rash, itching, dry skin Psych: Denies depression, anxiety, memory loss, and confusion Heme: Denies bruising, bleeding, and enlarged lymph nodes.  Physical Exam   BP (!) 151/65   Pulse 63   Temp 98.4 F (36.9 C)   Ht 5\' 1"  (1.549 m)   Wt 119 lb 3.2 oz (54.1 kg)   BMI 22.52 kg/m   General:   Alert and oriented. Pleasant and cooperative. Well-nourished and well-developed.  Head:  Normocephalic and atraumatic. Eyes:  Without icterus, sclera clear and conjunctiva pink.  Ears:  Normal auditory acuity. Mouth:  No deformity or lesions, oral mucosa pink.  Abdomen:  +BS, soft, non-tender and non-distended. No HSM noted. No guarding or rebound. No masses appreciated.  Rectal:  Deferred  Msk:  Symmetrical without gross deformities. Normal posture. Extremities:  Without edema. Neurologic:  Alert and  oriented x4;  grossly normal neurologically. Skin:  Intact without significant lesions or rashes. Psych:  Alert and cooperative. Normal mood and affect.  Assessment   [name] is a 89 y.o. female with a history of HLD, HTN, gout,, and possible TIA presenting today with complaints of  diarrhea and increased urgency postprandially.  Change in bowel habits, diarrhea:  - Having occasional urgency and diarrhea after meals - Symptoms occurring more commonly with solids and junk foods as well as binge eating - At times it seems a couple out given his forceful and very loose, sometimes with pieces. - No abdominal pain or decrease in appetite since being over acute illness a few months prior - Did experience some weight loss initially, however per review of my weights she is stable in  the 116-119 range over the last 2 months - Colonoscopy offered and discussed, patient declined - Differentials include malabsorption, lactose intolerance, pancreatic insufficiency all in relation to meals - For now advised Imodium as needed for severe diarrhea - Will assess KUB to rule out overflow diarrhea in the setting of constipation - Checking fecal elastase and fecal fat - Could consider biliary workup given relationship to fiber and higher fat content foods, not occurring with every meal that she eats  PLAN   fecal elastase, fecal fat KUB Imodium as needed Low fat diet. Possible trial of pancreatic enzymes vs [place] to evaluate gallbladder. Colonoscopy discussed, patient declined.  Follow up pending stool studies and xray.   Brooke Bonito, MSN, FNP-BC, AGACNP-BC [facility]

## 2023-04-11 ENCOUNTER — Encounter: Payer: Self-pay | Admitting: Gastroenterology

## 2023-04-11 DIAGNOSIS — M5416 Radiculopathy, lumbar region: Secondary | ICD-10-CM | POA: Diagnosis not present

## 2023-04-12 DIAGNOSIS — R197 Diarrhea, unspecified: Secondary | ICD-10-CM | POA: Diagnosis not present

## 2023-04-15 LAB — CLOSTRIDIUM DIFFICILE BY PCR: Toxigenic C. Difficile by PCR: NEGATIVE

## 2023-04-18 DIAGNOSIS — H04123 Dry eye syndrome of bilateral lacrimal glands: Secondary | ICD-10-CM | POA: Diagnosis not present

## 2023-04-19 ENCOUNTER — Other Ambulatory Visit: Payer: Self-pay

## 2023-04-19 DIAGNOSIS — I10 Essential (primary) hypertension: Secondary | ICD-10-CM

## 2023-04-19 MED ORDER — AMLODIPINE BESYLATE 5 MG PO TABS: 5.0000 mg | ORAL_TABLET | Freq: Every day | ORAL | 0 refills | Status: DC

## 2023-04-19 NOTE — Telephone Encounter (Signed)
 90day supply sent.

## 2023-04-23 LAB — PANCREATIC ELASTASE, FECAL: Pancreatic Elastase, Fecal: >800 ug Elast./g (ref >200)

## 2023-04-23 LAB — FECAL FAT, QUALITATIVE
Fat Qual Neutral, Stl: NORMAL
Fat Qual Total, Stl: NORMAL

## 2023-04-24 ENCOUNTER — Other Ambulatory Visit: Payer: Self-pay | Admitting: Gastroenterology

## 2023-04-24 DIAGNOSIS — R197 Diarrhea, unspecified: Secondary | ICD-10-CM

## 2023-04-28 ENCOUNTER — Ambulatory Visit
Admission: EM | Admit: 2023-04-28 | Discharge: 2023-04-28 | Disposition: A | Discharge status: Home / Self Care | Attending: Nurse Practitioner | Admitting: Nurse Practitioner

## 2023-04-28 ENCOUNTER — Ambulatory Visit: Payer: Self-pay

## 2023-04-28 DIAGNOSIS — R350 Frequency of micturition: Secondary | ICD-10-CM | POA: Diagnosis not present

## 2023-04-28 DIAGNOSIS — R509 Fever, unspecified: Secondary | ICD-10-CM

## 2023-04-28 LAB — POC COVID19/FLU A&B COMBO
Covid Antigen, POC: NEGATIVE
Influenza A Antigen, POC: NEGATIVE
Influenza B Antigen, POC: NEGATIVE

## 2023-04-28 LAB — POCT URINALYSIS DIP (MANUAL ENTRY)
Bilirubin, UA: NEGATIVE
Blood, UA: NEGATIVE
Glucose, UA: NEGATIVE mg/dL
Ketones, POC UA: NEGATIVE mg/dL
Leukocytes, UA: NEGATIVE
Nitrite, UA: NEGATIVE
Protein Ur, POC: NEGATIVE mg/dL
Spec Grav, UA: <=1.005 — AB (ref 1.010–1.025)
Urobilinogen, UA: 0.2 U/dL
pH, UA: 5.5 (ref 5.0–8.0)

## 2023-04-28 NOTE — ED Provider Notes (Signed)
 [facility]    CSN: [id number] Arrival date & time: 04/28/23  1459      History   Chief Complaint Chief Complaint  Patient presents with   Fever    HPI Theresa Morris is a 89 y.o. female.   The history is provided by the patient and a relative (Daughters present).   Patient brought in by her daughters for concern for a UTI.  Daughters report patient has been running a fever, Tmax 100.9.  Patient reports that she has had some urinary frequency, but states that is nothing new.  States that she began having UTIs last year.  Patient denies headache, ear pain, fatigue, nasal congestion, runny nose, chest pain, abdominal pain, nausea, vomiting, diarrhea, or rash.  Patient has been afebrile today.  Past Medical History:  Diagnosis Date   Arthritis    Complication of anesthesia    was told she had a small airway   Glaucoma    Hyperlipemia    Hypertension    TIA (transient ischemic attack)    had a finger go limp-was told tia.    Patient Active Problem List   Diagnosis Date Noted   Change in stool habits 03/28/2023   Vitamin D  deficiency 02/07/2023   Alkaline phosphatase raised 08/22/2022   Proteinuria 08/22/2022   Low back pain 07/05/2022   Decreased hearing 06/22/2022   Neuropathic pain 06/07/2022   Edema of lower extremity 06/07/2022   Headache 03/30/2022   Nasal congestion 03/29/2022   Cough 03/29/2022   Minimal cognitive impairment 02/11/2022   Hardening of the aorta (main artery of the heart) (HCC) 02/11/2022   Prediabetes 02/11/2022   Spinal stenosis of lumbosacral region 02/11/2022   Increased frequency of urination 02/08/2022   Muscle weakness 08/11/2021   Impaired fasting glucose 08/11/2021   Raised intraocular pressure 08/11/2021   Mixed hyperlipidemia 02/10/2021   Hyponatremia 02/10/2021   Hyperkalemia 02/10/2021   Onychomycosis 08/26/2020   Essential hypertension 08/03/2020   Pain in joint of left shoulder 08/31/2017   PVD (peripheral  vascular disease) (HCC) 11/05/2013    Past Surgical History:  Procedure Laterality Date   CESAREAN SECTION     EYE SURGERY  2009   rt eye   TONSILLECTOMY     TRIGGER FINGER RELEASE  09/27/2011   Procedure: RELEASE TRIGGER FINGER/A-1 PULLEY;  Surgeon: Kemp Patter, MD;  Location: [facility];  Service: Orthopedics;  Laterality: Left;  Left middle finger    OB History   No obstetric history on file.      Home Medications    Prior to Admission medications   Medication Sig Start Date End Date Taking? Authorizing Provider  amLODipine  (NORVASC ) 5 MG tablet Take 1 tablet (5 mg total) by mouth daily. 04/19/23   Tobi Fortes, MD  carvedilol  (COREG ) 6.25 MG tablet Take 1 tablet (6.25 mg total) by mouth 2 (two) times daily with a meal. 03/28/23   Tobi Fortes, MD  dorzolamide -timolol  (COSOPT ) 22.3-6.8 MG/ML ophthalmic solution Place 1 drop into both eyes 2 (two) times daily. 07/05/19   Avegno, Komlanvi S, FNP  gabapentin (NEURONTIN) 100 MG capsule Take 100 mg by mouth 3 (three) times daily.    [provider]  losartan  (COZAAR ) 100 MG tablet Take 1 tablet by mouth daily.    [provider]  meloxicam (MOBIC) 15 MG tablet Take 15 mg by mouth daily.    [provider]  rosuvastatin (CRESTOR) 5 MG tablet Take 5 mg by  mouth daily. 05/17/19   [provider]    Family History History reviewed. No pertinent family history.  Social History Social History   Tobacco Use   Smoking status: Never   Smokeless tobacco: Never  Substance Use Topics   Alcohol use: No   Drug use: No     Allergies   Patient has no known allergies.   Review of Systems Review of Systems Per HPI  Physical Exam Triage Vital Signs ED Triage Vitals  Encounter Vitals Group     BP 04/28/23 1505 (!) 151/58     Systolic BP Percentile --      Diastolic BP Percentile --      Pulse Rate 04/28/23 1505 63     Resp 04/28/23 1505 16     Temp 04/28/23 1505 97.8 F  (36.6 C)     Temp Source 04/28/23 1505 Oral     SpO2 04/28/23 1505 94 %     Weight --      Height --      Head Circumference --      Peak Flow --      Pain Score 04/28/23 1507 0     Pain Loc --      Pain Education --      Exclude from Growth Chart --    No data found.  Updated Vital Signs BP (!) 151/58 (BP Location: Right Arm)   Pulse 63   Temp 97.8 F (36.6 C) (Oral)   Resp 16   SpO2 94%   Visual Acuity Right Eye Distance:   Left Eye Distance:   Bilateral Distance:    Right Eye Near:   Left Eye Near:    Bilateral Near:     Physical Exam Vitals and nursing note reviewed.  Constitutional:      General: She is not in acute distress.    Appearance: Normal appearance.  HENT:     Head: Normocephalic.     Right Ear: Tympanic membrane, ear canal and external ear normal.     Left Ear: Tympanic membrane, ear canal and external ear normal.     Nose: Nose normal.     Mouth/Throat:     Mouth: Mucous membranes are moist.     Pharynx: No posterior oropharyngeal erythema.  Eyes:     Extraocular Movements: Extraocular movements intact.     Pupils: Pupils are equal, round, and reactive to light.  Cardiovascular:     Rate and Rhythm: Normal rate and regular rhythm.     Pulses: Normal pulses.     Heart sounds: Normal heart sounds.  Pulmonary:     Effort: Pulmonary effort is normal. No respiratory distress.     Breath sounds: Normal breath sounds. No stridor. No wheezing, rhonchi or rales.  Abdominal:     General: Bowel sounds are normal.     Palpations: Abdomen is soft.     Tenderness: There is no abdominal tenderness.  Musculoskeletal:     Cervical back: Normal range of motion.  Skin:    General: Skin is warm and dry.  Neurological:     General: No focal deficit present.     Mental Status: She is alert and oriented to person, place, and time.  Psychiatric:        Mood and Affect: Mood normal.        Behavior: Behavior normal.      UC Treatments / Results   Labs (all labs ordered are listed, but only abnormal results are  displayed) Labs Reviewed  POCT URINALYSIS DIP (MANUAL ENTRY) - Abnormal; Notable for the following components:      Result Value   Spec Grav, UA <=1.005 (*)    All other components within normal limits  POC COVID19/FLU A&B COMBO - Normal    EKG   Radiology No results found.  Procedures Procedures (including critical care time)  Medications Ordered in UC Medications - No data to display  Initial Impression / Assessment and Plan / UC Course  I have reviewed the triage vital signs and the nursing notes.  Pertinent labs & imaging results that were available during my care of the patient were reviewed by me and considered in my medical decision making (see chart for details).  Urinalysis was negative for UTI.  COVID/flu swab was also negative.  Patient has been afebrile today.  She is well-appearing, vital signs are stable at this time.  Difficult to determine the cause of her fever at this time as she is exhibiting no other symptoms.  Supportive care recommendations were provided and discussed with patient and her daughters to include over-the-counter Tylenol , fluids, rest, and to monitor for worsening.  Patient advised to follow-up with PCP if fever continues to persist for further evaluation.  Patient and daughters were in agreement with this plan of care and verbalized understanding.  All questions were answered.  Patient stable for discharge.  Final Clinical Impressions(s) / UC Diagnoses   Final diagnoses:  Fever, unspecified  Urinary frequency     Discharge Instructions      COVID/flu test and urinalysis were negative. May take over-the-counter Tylenol  as needed for pain, fever, or general discomfort. Increase fluids and allow for plenty of rest. Continue to monitor for worsening fever.  If fever continues to persist, please follow-up with her PCP for further evaluation. Follow-up as needed.  HAPPY  BIRTHDAY!!!!!!!!!!!!     ED Prescriptions   None    PDMP not reviewed this encounter.   Hardy Lia, NP 04/28/23 1606

## 2023-04-28 NOTE — Telephone Encounter (Signed)
Patient advised to go to urgent care

## 2023-04-28 NOTE — Telephone Encounter (Signed)
 Copied from CRM [id number]. Topic: Clinical - Medical Advice >> Apr 28, 2023 10:03 AM Everlene Hobby D wrote: Patient has had low grade fever this week 100-101 and maybe has a UTI April Hopkins patient daughter- [phone]  Chief Complaint: frequency with urination and fever Symptoms: see above Frequency: since Tuesday Pertinent Negatives: Patient denies pain Disposition: [] ED /[x] Urgent Care (no appt availability in office) / [] Appointment(In office/virtual)/ []  [facility]/ [] Home Care/ [] Refused Recommended Disposition /[] [facility]/ []  Follow-up with PCP Additional Notes: no apts available; instructed to go to UC. Care advice given, denies questions; instructed to go to ER if becomes worse.   Reason for Disposition  Age > 50 years  Answer Assessment - Initial Assessment Questions 1. SEVERITY: "How bad is the pain?"  (e.g., Scale 1-10; mild, moderate, or severe)   - MILD (1-3): complains slightly about urination hurting   - MODERATE (4-7): interferes with normal activities     - SEVERE (8-10): excruciating, unwilling or unable to urinate because of the pain      denies 2. FREQUENCY: "How many times have you had painful urination today?"      denies 3. PATTERN: "Is pain present every time you urinate or just sometimes?"      denies 4. ONSET: "When did the painful urination start?"      Tuesday night 5. FEVER: "Do you have a fever?" If Yes, ask: "What is your temperature, how was it measured, and when did it start?"     Yes, 100-101 6. PAST UTI: "Have you had a urine infection before?" If Yes, ask: "When was the last time?" and "What happened that time?"      yes 7. CAUSE: "What do you think is causing the painful urination?"  (e.g., UTI, scratch, Herpes sore)     UTI 8. OTHER SYMPTOMS: "Do you have any other symptoms?" (e.g., blood in urine, flank pain, genital sores, urgency, vaginal discharge)     Frequency with urination 9. PREGNANCY: "Is there any chance you  are pregnant?" "When was your last menstrual period?"     no  Protocols used: Urination Pain - Female-A-AH

## 2023-04-28 NOTE — Discharge Instructions (Addendum)
 COVID/flu test and urinalysis were negative. May take over-the-counter Tylenol  as needed for pain, fever, or general discomfort. Increase fluids and allow for plenty of rest. Continue to monitor for worsening fever.  If fever continues to persist, please follow-up with her PCP for further evaluation. Follow-up as needed.  HAPPY BIRTHDAY!!!!!!!!!!!!

## 2023-04-28 NOTE — ED Triage Notes (Signed)
 Pt states she had a fever last night.  Denies any burning with urination but has had some urinary frequency.

## 2023-05-04 ENCOUNTER — Encounter: Payer: Self-pay | Admitting: Internal Medicine

## 2023-05-05 DIAGNOSIS — M5416 Radiculopathy, lumbar region: Secondary | ICD-10-CM | POA: Diagnosis not present

## 2023-05-08 ENCOUNTER — Other Ambulatory Visit: Payer: Self-pay | Admitting: Internal Medicine

## 2023-05-08 DIAGNOSIS — I10 Essential (primary) hypertension: Secondary | ICD-10-CM

## 2023-05-09 ENCOUNTER — Ambulatory Visit: Payer: Medicare PPO | Admitting: Internal Medicine

## 2023-05-09 ENCOUNTER — Encounter: Payer: Self-pay | Admitting: Internal Medicine

## 2023-05-09 DIAGNOSIS — I10 Essential (primary) hypertension: Secondary | ICD-10-CM

## 2023-05-09 MED ORDER — AMLODIPINE BESYLATE 5 MG PO TABS: 5.0000 mg | ORAL_TABLET | Freq: Every day | ORAL | 3 refills | Status: AC

## 2023-05-09 NOTE — Progress Notes (Signed)
 Established Patient Office Visit  Subjective   Patient ID: Theresa Morris, female    DOB: 1931/04/11  Age: 89 y.o. MRN: [id number]  Chief Complaint  Patient presents with   Care Management    Three month follow up    Theresa Morris returns today for routine follow-up.  She was last evaluated by me on 2/4 for HTN follow-up.  No additional medication changes were made at that time and 13-month follow-up was arranged.  In the interim, she was evaluated at [facility] for an acute visit on 3/25 endorsing a change in stool habits.  She was referred to gastroenterology and subsequently evaluated 4/7.  Urgent care presentation 4/25 in the setting of a fever with concern for UTI.  There have otherwise been no acute interval events.  Theresa Morris reports feeling well today and has no acute concerns to discuss.  Past Medical History:  Diagnosis Date   Arthritis    Complication of anesthesia    was told she had a small airway   Glaucoma    Hyperlipemia    Hypertension    TIA (transient ischemic attack)    had a finger go limp-was told tia.   Past Surgical History:  Procedure Laterality Date   CESAREAN SECTION     EYE SURGERY  2009   rt eye   TONSILLECTOMY     TRIGGER FINGER RELEASE  09/27/2011   Procedure: RELEASE TRIGGER FINGER/A-1 PULLEY;  Surgeon: Kemp Patter, MD;  Location: [facility];  Service: Orthopedics;  Laterality: Left;  Left middle finger   Social History   Tobacco Use   Smoking status: Never   Smokeless tobacco: Never  Substance Use Topics   Alcohol use: No   Drug use: No   History reviewed. No pertinent family history. No Known Allergies  Review of Systems  Constitutional:  Negative for chills and fever.  HENT:  Negative for sore throat.   Respiratory:  Negative for cough and shortness of breath.   Cardiovascular:  Negative for chest pain, palpitations and leg swelling.  Gastrointestinal:  Negative for abdominal pain, blood in stool, constipation,  diarrhea, nausea and vomiting.  Genitourinary:  Negative for dysuria and hematuria.  Musculoskeletal:  Negative for myalgias.  Skin:  Negative for itching and rash.  Neurological:  Negative for dizziness and headaches.  Psychiatric/Behavioral:  Negative for depression and suicidal ideas.      Objective:     BP (!) 132/55   Pulse 68   Ht 5\' 1"  (1.549 m)   Wt 119 lb 6.4 oz (54.2 kg)   SpO2 96%   BMI 22.56 kg/m  BP Readings from Last 3 Encounters:  05/09/23 (!) 132/55  04/28/23 (!) 151/58   Physical Exam Vitals reviewed.  Constitutional:      General: She is not in acute distress.    Appearance: Normal appearance. She is not toxic-appearing.  HENT:     Head: Normocephalic and atraumatic.     Right Ear: External ear normal.     Left Ear: External ear normal.     Nose: Nose normal. No congestion or rhinorrhea.     Mouth/Throat:     Mouth: Mucous membranes are moist.     Pharynx: Oropharynx is clear. No oropharyngeal exudate or posterior oropharyngeal erythema.  Eyes:     General: No scleral icterus.    Extraocular Movements: Extraocular movements intact.     Conjunctiva/sclera: Conjunctivae normal.     Pupils: Pupils are equal, round, and  reactive to light.  Cardiovascular:     Rate and Rhythm: Normal rate and regular rhythm.     Pulses: Normal pulses.     Heart sounds: Normal heart sounds. No murmur heard.    No friction rub. No gallop.  Pulmonary:     Effort: Pulmonary effort is normal.     Breath sounds: Normal breath sounds. No wheezing, rhonchi or rales.  Abdominal:     General: Abdomen is flat. Bowel sounds are normal. There is no distension.     Palpations: Abdomen is soft.     Tenderness: There is no abdominal tenderness.  Musculoskeletal:        General: No swelling. Normal range of motion.     Cervical back: Normal range of motion.     Right lower leg: No edema.     Left lower leg: No edema.  Lymphadenopathy:     Cervical: No cervical adenopathy.   Skin:    General: Skin is warm and dry.     Capillary Refill: Capillary refill takes less than 2 seconds.     Coloration: Skin is not jaundiced.  Neurological:     General: No focal deficit present.     Mental Status: She is alert and oriented to person, place, and time.  Psychiatric:        Mood and Affect: Mood normal.        Behavior: Behavior normal.   Last CBC Lab Results  Component Value Date   WBC 9.3 01/03/2023   HGB 12.3 01/03/2023   HCT 36.7 01/03/2023   MCV 91 01/03/2023   MCH 30.5 01/03/2023   RDW 12.7 01/03/2023   PLT 314 01/03/2023   Last metabolic panel Lab Results  Component Value Date   GLUCOSE 106 (H) 01/03/2023   NA 141 01/03/2023   K 4.7 01/03/2023   CL 104 01/03/2023   CO2 24 01/03/2023   BUN 15 01/03/2023   CREATININE 0.91 01/03/2023   EGFR 60 01/03/2023   CALCIUM 9.4 01/03/2023   PROT 6.4 01/03/2023   ALBUMIN 4.4 01/03/2023   LABGLOB 2.0 01/03/2023   BILITOT 0.4 01/03/2023   ALKPHOS 132 (H) 01/03/2023   AST 22 01/03/2023   ALT 12 01/03/2023   ANIONGAP 6 09/06/2017   Last lipids Lab Results  Component Value Date   CHOL 177 01/03/2023   HDL 76 01/03/2023   LDLCALC 89 01/03/2023   TRIG 60 01/03/2023   CHOLHDL 2.3 01/03/2023   Last vitamin D  Lab Results  Component Value Date   VD25OH 40.4 03/16/2023     Assessment & Plan:   Problem List Items Addressed This Visit       Essential hypertension   Remains adequately controlled on current antihypertensive regimen consisting of amlodipine  5 mg daily, carvedilol  6.25 mg twice daily, and losartan  100 mg daily.  Amlodipine  refilled today.       Return in about 6 months (around 11/09/2023).    Tobi Fortes, MD

## 2023-05-09 NOTE — Patient Instructions (Signed)
It was a pleasure to see you today.  Thank you for giving [place] the opportunity to be involved in your care.  Below is a brief recap of your visit and next steps.  We will plan to see you again in 6 months  Summary No medication changes today Follow up in 6 months

## 2023-05-12 ENCOUNTER — Encounter: Payer: Self-pay | Admitting: Internal Medicine

## 2023-05-19 ENCOUNTER — Encounter: Payer: Self-pay | Admitting: Internal Medicine

## 2023-05-19 NOTE — Telephone Encounter (Signed)
 Spoke with patients daughter. Appt 5/22 @2 :00pm

## 2023-05-25 ENCOUNTER — Encounter: Payer: Self-pay | Admitting: Internal Medicine

## 2023-05-25 ENCOUNTER — Ambulatory Visit: Admitting: Internal Medicine

## 2023-05-25 VITALS — BP 164/60 | HR 76 | Ht 61.0 in | Wt 118.4 lb

## 2023-05-25 DIAGNOSIS — R600 Localized edema: Secondary | ICD-10-CM | POA: Diagnosis not present

## 2023-05-25 DIAGNOSIS — I10 Essential (primary) hypertension: Secondary | ICD-10-CM

## 2023-05-25 DIAGNOSIS — R350 Frequency of micturition: Secondary | ICD-10-CM | POA: Diagnosis not present

## 2023-05-25 MED ORDER — CARVEDILOL 12.5 MG PO TABS: 12.5000 mg | ORAL_TABLET | Freq: Two times a day (BID) | ORAL | 1 refills | Status: AC

## 2023-05-25 NOTE — Patient Instructions (Signed)
 Please start taking Carvedilol  12.5 mg twice daily instead of 6.25 mg.  Please continue Amlodipine  and Losartan  for now.  Please continue to follow low salt diet and ambulate as tolerated.

## 2023-05-25 NOTE — Progress Notes (Signed)
 Acute Office Visit  Subjective:    Patient ID: Theresa Morris, female    DOB: [birth year], 89 y.o.   MRN: [id number]  Chief Complaint  Patient presents with   Foot Swelling    Pt reports sx of swelling in feet, d/c the amlodipine  per pcp, now started amlodipine  again and swelling is better.     HPI Patient is in today for evaluation of BP, which has been elevated at home recently, up to 170s/70s.  Her BP was elevated in the office today as well.  She also reports bilateral leg swelling, and had to stop amlodipine  for few days, but did not make any difference in terms of her leg swelling.  She has noticed improvement in leg swelling with leg elevation, but has not tried compression socks yet.  She has started taking amlodipine  again.  She also takes losartan  100 mg QD and Coreg  6.25 mg BID.  Past Medical History:  Diagnosis Date   Arthritis    Complication of anesthesia    was told she had a small airway   Glaucoma    Hyperlipemia    Hypertension    TIA (transient ischemic attack)    had a finger go limp-was told tia.    Past Surgical History:  Procedure Laterality Date   CESAREAN SECTION     EYE SURGERY  2009   rt eye   TONSILLECTOMY     TRIGGER FINGER RELEASE  09/27/2011   Procedure: RELEASE TRIGGER FINGER/A-1 PULLEY;  Surgeon: Kemp Patter, MD;  Location: [facility];  Service: Orthopedics;  Laterality: Left;  Left middle finger    History reviewed. No pertinent family history.  Social History   Socioeconomic History   Marital status: Married    Spouse name: Not on file   Number of children: Not on file   Years of education: Not on file   Highest education level: Not on file  Occupational History   Not on file  Tobacco Use   Smoking status: Never   Smokeless tobacco: Never  Substance and Sexual Activity   Alcohol use: No   Drug use: No   Sexual activity: Not on file  Other Topics Concern   Not on file  Social History Narrative   Not on  file   Social Drivers of Health   Financial Resource Strain: Not on file  Food Insecurity: Not on file  Transportation Needs: Not on file  Physical Activity: Not on file  Stress: Not on file  Social Connections: Not on file  Intimate Partner Violence: Not on file    Outpatient Medications Prior to Visit  Medication Sig Dispense Refill   amLODipine  (NORVASC ) 5 MG tablet Take 1 tablet (5 mg total) by mouth daily. 90 tablet 3   dorzolamide -timolol  (COSOPT ) 22.3-6.8 MG/ML ophthalmic solution Place 1 drop into both eyes 2 (two) times daily. 10 mL 2   gabapentin (NEURONTIN) 100 MG capsule Take 100 mg by mouth 3 (three) times daily.     losartan  (COZAAR ) 100 MG tablet TAKE ONE TABLET BY MOUTH EVERY DAY 90 tablet 1   meloxicam (MOBIC) 15 MG tablet Take 15 mg by mouth daily.     rosuvastatin (CRESTOR) 5 MG tablet Take 5 mg by mouth daily.     carvedilol  (COREG ) 6.25 MG tablet Take 1 tablet (6.25 mg total) by mouth 2 (two) times daily with a meal. 180 tablet 0   No facility-administered medications prior to visit.    No  Known Allergies  Review of Systems  Constitutional:  Negative for chills and fever.  HENT:  Negative for congestion and sore throat.   Eyes:  Negative for pain and discharge.  Respiratory:  Negative for cough and shortness of breath.   Cardiovascular:  Positive for leg swelling. Negative for chest pain and palpitations.  Gastrointestinal:  Negative for abdominal pain, diarrhea, nausea and vomiting.  Endocrine: Negative for polydipsia and polyuria.  Genitourinary:  Positive for frequency. Negative for dysuria and hematuria.  Musculoskeletal:  Negative for neck pain and neck stiffness.  Skin:  Negative for rash.  Neurological:  Negative for dizziness and weakness.  Psychiatric/Behavioral:  Negative for agitation and behavioral problems.        Objective:     Physical Exam Vitals reviewed.  Constitutional:      General: She is not in acute distress.     Appearance: She is not diaphoretic.  HENT:     Head: Normocephalic and atraumatic.     Nose: Nose normal.     Mouth/Throat:     Mouth: Mucous membranes are moist.  Eyes:     General: No scleral icterus.    Extraocular Movements: Extraocular movements intact.  Cardiovascular:     Rate and Rhythm: Normal rate and regular rhythm.     Heart sounds: Normal heart sounds. No murmur heard. Pulmonary:     Breath sounds: Normal breath sounds. No wheezing or rales.  Musculoskeletal:     Cervical back: Neck supple. No tenderness.     Right lower leg: Edema (1+) present.     Left lower leg: Edema (1+) present.  Skin:    General: Skin is warm.     Findings: No rash.  Neurological:     General: No focal deficit present.     Mental Status: She is alert and oriented to person, place, and time.  Psychiatric:        Mood and Affect: Mood normal.        Behavior: Behavior normal.     BP (!) 164/60 (BP Location: Left Arm)   Pulse 76   Ht 5\' 1"  (1.549 m)   Wt 118 lb 6.4 oz (53.7 kg)   SpO2 91%   BMI 22.37 kg/m  Wt Readings from Last 3 Encounters:  05/25/23 118 lb 6.4 oz (53.7 kg)  05/09/23 119 lb 6.4 oz (54.2 kg)  04/10/23 119 lb 3.2 oz (54.1 kg)        Assessment & Plan:   Problem List Items Addressed This Visit       Cardiovascular and Mediastinum   Essential hypertension - Primary   BP Readings from Last 1 Encounters:  05/25/23 (!) 164/60   Uncontrolled with amlodipine  5 mg QD, losartan  100 mg QD and carvedilol  6.25 mg BID Increased dose of carvedilol  to 12.5 mg BID Counseled for compliance with the medications Advised low salt diet and ambulate as tolerated       Relevant Medications   carvedilol  (COREG ) 12.5 MG tablet     Other   Urinary frequency   Likely due to overactive bladder Would avoid diuretic use for now despite her leg swelling Scheduled voiding advised Avoid caffeinated products especially in the evening      Edema of lower extremity   Bilateral  leg swelling, likely due to chronic venous insufficiency Advised to perform leg elevation and use compression socks as tolerated Would avoid diuretic if possible, as she already has urinary frequency at nighttime  Meds ordered this encounter  Medications   carvedilol  (COREG ) 12.5 MG tablet    Sig: Take 1 tablet (12.5 mg total) by mouth 2 (two) times daily with a meal.    Dispense:  180 tablet    Refill:  1     Anjalee Cope Alyssa Backbone, MD

## 2023-05-26 NOTE — Assessment & Plan Note (Signed)
 BP Readings from Last 1 Encounters:  05/25/23 (!) 164/60   Uncontrolled with amlodipine  5 mg QD, losartan  100 mg QD and carvedilol  6.25 mg BID Increased dose of carvedilol  to 12.5 mg BID Counseled for compliance with the medications Advised low salt diet and ambulate as tolerated

## 2023-05-26 NOTE — Assessment & Plan Note (Addendum)
 Bilateral leg swelling, likely due to chronic venous insufficiency Advised to perform leg elevation and use compression socks as tolerated Would avoid diuretic if possible, as she already has urinary frequency at nighttime

## 2023-05-26 NOTE — Assessment & Plan Note (Signed)
 Likely due to overactive bladder Would avoid diuretic use for now despite her leg swelling Scheduled voiding advised Avoid caffeinated products especially in the evening

## 2023-06-01 ENCOUNTER — Encounter: Payer: Self-pay | Admitting: Internal Medicine

## 2023-06-01 NOTE — Assessment & Plan Note (Signed)
 Remains adequately controlled on current antihypertensive regimen consisting of amlodipine  5 mg daily, carvedilol  6.25 mg twice daily, and losartan  100 mg daily.  Amlodipine  refilled today.

## 2023-06-11 ENCOUNTER — Encounter: Payer: Self-pay | Admitting: Internal Medicine

## 2023-06-16 ENCOUNTER — Ambulatory Visit

## 2023-06-16 VITALS — BP 151/64 | HR 68 | Ht 61.0 in | Wt 114.8 lb

## 2023-06-16 DIAGNOSIS — R600 Localized edema: Secondary | ICD-10-CM | POA: Diagnosis not present

## 2023-06-16 DIAGNOSIS — I10 Essential (primary) hypertension: Secondary | ICD-10-CM

## 2023-06-16 MED ORDER — HYDROCHLOROTHIAZIDE 12.5 MG PO TABS: 12.5000 mg | ORAL_TABLET | Freq: Every day | ORAL | 1 refills | Status: DC

## 2023-06-16 NOTE — Progress Notes (Unsigned)
   Established Patient Office Visit  Subjective   Patient ID: Theresa Morris, female    DOB: 1931/07/20  Age: 89 y.o. MRN: [id number]  Chief Complaint  Patient presents with   Medical Management of Chronic Issues    Pt states Swelling and Bp issues    HPI  {History (Optional):23778}  ROS    Objective:     BP (!) 151/64   Pulse 68   Ht 5' 1 (1.549 m)   Wt 114 lb 12.8 oz (52.1 kg)   SpO2 94%   BMI 21.69 kg/m  {Vitals History (Optional):23777}  Physical Exam   No results found for any visits on 06/16/23.  {Labs (Optional):23779}  The ASCVD Risk score (Arnett DK, et al., 2019) failed to calculate for the following reasons:   The 2019 ASCVD risk score is only valid for ages 51 to 31    Assessment & Plan:   Problem List Items Addressed This Visit   None   No follow-ups on file.    Alison Irvine, FNP

## 2023-06-17 LAB — BASIC METABOLIC PANEL WITH GFR
BUN/Creatinine Ratio: 12 (ref 12–28)
BUN: 10 mg/dL (ref 10–36)
CO2: 21 mmol/L (ref 20–29)
Calcium: 8.9 mg/dL (ref 8.7–10.3)
Chloride: 106 mmol/L (ref 96–106)
Creatinine, Ser: 0.83 mg/dL (ref 0.57–1.00)
Glucose: 103 mg/dL — ABNORMAL HIGH (ref 70–99)
Potassium: 3.3 mmol/L — ABNORMAL LOW (ref 3.5–5.2)
Sodium: 143 mmol/L (ref 134–144)
eGFR: 67 mL/min/{1.73_m2} (ref >59)

## 2023-06-19 ENCOUNTER — Ambulatory Visit: Payer: Self-pay

## 2023-06-19 NOTE — Assessment & Plan Note (Signed)
 Bilateral leg swelling, likely due to chronic venous insufficiency Advised to perform leg elevation and use compression socks as tolerated Agreed to add hydrochlorothiazide, but advised to take in the morning to avoid nighttime urination.  Check BMP today.

## 2023-06-19 NOTE — Assessment & Plan Note (Signed)
 Elevated at this time.  Will add hydrochlorothiazide for added BP coverage.  Check BMP today as well.  Recommend follow-up in 1 to 2 months to recheck blood pressure.

## 2023-06-20 ENCOUNTER — Other Ambulatory Visit: Payer: Self-pay

## 2023-06-20 ENCOUNTER — Emergency Department (HOSPITAL_COMMUNITY)
Admission: EM | Admit: 2023-06-20 | Discharge: 2023-06-20 | Disposition: A | Discharge status: Home / Self Care | Attending: Student | Admitting: Student

## 2023-06-20 ENCOUNTER — Encounter (HOSPITAL_COMMUNITY): Payer: Self-pay

## 2023-06-20 DIAGNOSIS — E860 Dehydration: Secondary | ICD-10-CM | POA: Diagnosis not present

## 2023-06-20 DIAGNOSIS — E876 Hypokalemia: Secondary | ICD-10-CM | POA: Insufficient documentation

## 2023-06-20 DIAGNOSIS — Z79899 Other long term (current) drug therapy: Secondary | ICD-10-CM | POA: Insufficient documentation

## 2023-06-20 DIAGNOSIS — R55 Syncope and collapse: Secondary | ICD-10-CM | POA: Diagnosis not present

## 2023-06-20 DIAGNOSIS — I10 Essential (primary) hypertension: Secondary | ICD-10-CM | POA: Insufficient documentation

## 2023-06-20 DIAGNOSIS — I959 Hypotension, unspecified: Secondary | ICD-10-CM | POA: Diagnosis not present

## 2023-06-20 DIAGNOSIS — R001 Bradycardia, unspecified: Secondary | ICD-10-CM | POA: Diagnosis not present

## 2023-06-20 LAB — CBC WITH DIFFERENTIAL/PLATELET
Abs Immature Granulocytes: 0.02 10*3/uL (ref 0.00–0.07)
Basophils Absolute: 0 10*3/uL (ref 0.0–0.1)
Basophils Relative: 1 %
Eosinophils Absolute: 0.1 10*3/uL (ref 0.0–0.5)
Eosinophils Relative: 2 %
HCT: 31.8 % — ABNORMAL LOW (ref 36.0–46.0)
Hemoglobin: 10.6 g/dL — ABNORMAL LOW (ref 12.0–15.0)
Immature Granulocytes: 0 %
Lymphocytes Relative: 42 %
Lymphs Abs: 3.1 10*3/uL (ref 0.7–4.0)
MCH: 30.4 pg (ref 26.0–34.0)
MCHC: 33.3 g/dL (ref 30.0–36.0)
MCV: 91.1 fL (ref 80.0–100.0)
Monocytes Absolute: 0.9 10*3/uL (ref 0.1–1.0)
Monocytes Relative: 12 %
Neutro Abs: 3.2 10*3/uL (ref 1.7–7.7)
Neutrophils Relative %: 43 %
Platelets: 205 10*3/uL (ref 150–400)
RBC: 3.49 MIL/uL — ABNORMAL LOW (ref 3.87–5.11)
RDW: 12.9 % (ref 11.5–15.5)
WBC: 7.3 10*3/uL (ref 4.0–10.5)
nRBC: 0 % (ref 0.0–0.2)

## 2023-06-20 LAB — COMPREHENSIVE METABOLIC PANEL WITH GFR
ALT: 11 U/L (ref 0–44)
AST: 20 U/L (ref 15–41)
Albumin: 3.3 g/dL — ABNORMAL LOW (ref 3.5–5.0)
Alkaline Phosphatase: 89 U/L (ref 38–126)
Anion gap: 10 (ref 5–15)
BUN: 10 mg/dL (ref 8–23)
CO2: 26 mmol/L (ref 22–32)
Calcium: 8.3 mg/dL — ABNORMAL LOW (ref 8.9–10.3)
Chloride: 103 mmol/L (ref 98–111)
Creatinine, Ser: 0.99 mg/dL (ref 0.44–1.00)
GFR, Estimated: 54 mL/min — ABNORMAL LOW (ref >60)
Glucose, Bld: 127 mg/dL — ABNORMAL HIGH (ref 70–99)
Potassium: 2.4 mmol/L — CL (ref 3.5–5.1)
Sodium: 139 mmol/L (ref 135–145)
Total Bilirubin: 0.9 mg/dL (ref 0.0–1.2)
Total Protein: 5.9 g/dL — ABNORMAL LOW (ref 6.5–8.1)

## 2023-06-20 LAB — POTASSIUM: Potassium: 3.6 mmol/L (ref 3.5–5.1)

## 2023-06-20 LAB — TROPONIN I (HIGH SENSITIVITY): Troponin I (High Sensitivity): 10 ng/L (ref <18)

## 2023-06-20 MED ORDER — LACTATED RINGERS IV BOLUS
1000.0000 mL | Freq: Once | INTRAVENOUS | Status: AC
  Administered 2023-06-20: 1000 mL via INTRAVENOUS

## 2023-06-20 MED ORDER — POTASSIUM CHLORIDE 10 MEQ/100ML IV SOLN
10.0000 meq | Freq: Once | INTRAVENOUS | Status: AC
  Administered 2023-06-20: 10 meq via INTRAVENOUS
  Filled 2023-06-20: qty 100

## 2023-06-20 MED ORDER — POTASSIUM CHLORIDE 20 MEQ PO PACK
40.0000 meq | PACK | Freq: Once | ORAL | Status: AC
  Administered 2023-06-20: 40 meq via ORAL
  Filled 2023-06-20: qty 2

## 2023-06-20 MED ORDER — MAGNESIUM OXIDE -MG SUPPLEMENT 400 (240 MG) MG PO TABS
800.0000 mg | ORAL_TABLET | Freq: Once | ORAL | Status: AC
  Administered 2023-06-20: 800 mg via ORAL
  Filled 2023-06-20: qty 2

## 2023-06-20 MED ORDER — POTASSIUM CHLORIDE CRYS ER 20 MEQ PO TBCR
40.0000 meq | EXTENDED_RELEASE_TABLET | Freq: Once | ORAL | Status: AC
  Administered 2023-06-20: 40 meq via ORAL
  Filled 2023-06-20: qty 2

## 2023-06-20 MED ORDER — POTASSIUM CHLORIDE CRYS ER 20 MEQ PO TBCR: 20.0000 meq | EXTENDED_RELEASE_TABLET | Freq: Every day | ORAL | 1 refills | Status: DC

## 2023-06-20 NOTE — ED Triage Notes (Signed)
 Pt reports she was standing for a lot longer then she usually does trying to chop vegetables for a stew she was trying to make for her granddaughter since she just had a baby.  Pt states she started to feel very tired and went to sit down and then she passed out.  Pt sister called EMS and Pt was not responsive but breathing when they arrived.  EMS reports pt became alert when they moved her to the stretcher and she became alert and oriented and back to baseline at that time.

## 2023-06-20 NOTE — ED Provider Notes (Signed)
 [place] EMERGENCY DEPARTMENT AT [facility] Provider Note  CSN: [id number] Arrival date & time: 06/20/23 1118  Chief Complaint(s) Loss of Consciousness  HPI Theresa Morris is a 89 y.o. female with PMH HTN, HLD, TIA who presents emergency room for evaluation of a syncopal episode.  Patient recently started on diuretic medication and states that she has been suffering from persistent diarrhea.  Patient recently saw her PCP and was found to have hypokalemia of which she received a call today and potassium supplementation was sent to the pharmacy today.  She was cooking in the kitchen and standing for a longer period of time than she is used to when she started to feel dizzy.  She sat down in a chair and then had a syncopal episode.  Patient reportedly found to be hypotensive and bradycardic in the field but these have all spontaneously improved with no EMS interventions.  No postictal period.  Given the risk,, she has returned to normal mental status baseline and is denying chest pain, shortness of breath, abdominal pain, nausea, vomiting or other system symptoms.   Past Medical History Past Medical History:  Diagnosis Date   Arthritis    Complication of anesthesia    was told she had a small airway   Glaucoma    Hyperlipemia    Hypertension    TIA (transient ischemic attack)    had a finger go limp-was told tia.   Patient Active Problem List   Diagnosis Date Noted   Change in stool habits 03/28/2023   Vitamin D  deficiency 02/07/2023   Proteinuria 08/22/2022   Low back pain 07/05/2022   Decreased hearing 06/22/2022   Neuropathic pain 06/07/2022   Edema of lower extremity 06/07/2022   Minimal cognitive impairment 02/11/2022   Hardening of the aorta (main artery of the heart) (HCC) 02/11/2022   Prediabetes 02/11/2022   Spinal stenosis of lumbosacral region 02/11/2022   Urinary frequency 02/08/2022   Raised intraocular pressure 08/11/2021   Mixed hyperlipidemia  02/10/2021   Hyponatremia 02/10/2021   Onychomycosis 08/26/2020   Essential hypertension 08/03/2020   Pain in joint of left shoulder 08/31/2017   PVD (peripheral vascular disease) (HCC) 11/05/2013   Home Medication(s) Prior to Admission medications   Medication Sig Start Date End Date Taking? Authorizing Provider  amLODipine  (NORVASC ) 5 MG tablet Take 1 tablet (5 mg total) by mouth daily. 05/09/23   Tobi Fortes, MD  carvedilol  (COREG ) 12.5 MG tablet Take 1 tablet (12.5 mg total) by mouth 2 (two) times daily with a meal. 05/25/23   Meldon Sport, MD  dorzolamide -timolol  (COSOPT ) 22.3-6.8 MG/ML ophthalmic solution Place 1 drop into both eyes 2 (two) times daily. 07/05/19   Avegno, Komlanvi S, FNP  gabapentin (NEURONTIN) 100 MG capsule Take 100 mg by mouth 3 (three) times daily.    [provider]  hydrochlorothiazide (HYDRODIURIL) 12.5 MG tablet Take 1 tablet (12.5 mg total) by mouth daily. 06/16/23   Alison Irvine, FNP  losartan  (COZAAR ) 100 MG tablet TAKE ONE TABLET BY MOUTH EVERY DAY 05/08/23   Dixon, Phillip E, MD  meloxicam (MOBIC) 15 MG tablet Take 15 mg by mouth daily.    [provider]  potassium chloride SA (KLOR-CON M) 20 MEQ tablet Take 1 tablet (20 mEq total) by mouth daily. 06/20/23   Alison Irvine, FNP  rosuvastatin (CRESTOR) 5 MG tablet Take 5 mg by mouth daily. 05/17/19   [provider]  Past Surgical History Past Surgical History:  Procedure Laterality Date   CESAREAN SECTION     EYE SURGERY  2009   rt eye   TONSILLECTOMY     TRIGGER FINGER RELEASE  09/27/2011   Procedure: RELEASE TRIGGER FINGER/A-1 PULLEY;  Surgeon: Kemp Patter, MD;  Location:  [facility];  Service: Orthopedics;  Laterality: Left;  Left middle finger   Family History History reviewed. No pertinent family history.  Social  History Social History   Tobacco Use   Smoking status: Never   Smokeless tobacco: Never  Vaping Use   Vaping status: Never Used  Substance Use Topics   Alcohol use: No   Drug use: No   Allergies Patient has no known allergies.  Review of Systems Review of Systems  Neurological:  Positive for syncope.    Physical Exam Vital Signs  I have reviewed the triage vital signs BP 122/68   Pulse 66   Temp 97.9 F (36.6 C) (Oral)   Resp 16   Ht 5' 1 (1.549 m)   Wt 52.1 kg   SpO2 98%   BMI 21.69 kg/m   Physical Exam Vitals and nursing note reviewed.  Constitutional:      General: She is not in acute distress.    Appearance: She is well-developed.  HENT:     Head: Normocephalic and atraumatic.   Eyes:     Conjunctiva/sclera: Conjunctivae normal.    Cardiovascular:     Rate and Rhythm: Normal rate and regular rhythm.     Heart sounds: No murmur heard. Pulmonary:     Effort: Pulmonary effort is normal. No respiratory distress.     Breath sounds: Normal breath sounds.  Abdominal:     Palpations: Abdomen is soft.     Tenderness: There is no abdominal tenderness.   Musculoskeletal:        General: No swelling.     Cervical back: Neck supple.   Skin:    General: Skin is warm and dry.     Capillary Refill: Capillary refill takes less than 2 seconds.   Neurological:     Mental Status: She is alert.   Psychiatric:        Mood and Affect: Mood normal.     ED Results and Treatments Labs (all labs ordered are listed, but only abnormal results are displayed) Labs Reviewed  COMPREHENSIVE METABOLIC PANEL WITH GFR - Abnormal; Notable for the following components:      Result Value   Potassium 2.4 (*)    Glucose, Bld 127 (*)    Calcium 8.3 (*)    Total Protein 5.9 (*)    Albumin 3.3 (*)    GFR, Estimated 54 (*)    All other components within normal limits  CBC WITH DIFFERENTIAL/PLATELET - Abnormal; Notable for the following components:   RBC 3.49 (*)     Hemoglobin 10.6 (*)    HCT 31.8 (*)    All other components within normal limits  POTASSIUM  TROPONIN I (HIGH SENSITIVITY)  Radiology No results found.  Pertinent labs & imaging results that were available during my care of the patient were reviewed by me and considered in my medical decision making (see MDM for details).  Medications Ordered in ED Medications  lactated ringers  bolus 1,000 mL (0 mLs Intravenous Stopped 06/20/23 1352)  potassium chloride SA (KLOR-CON M) CR tablet 40 mEq (40 mEq Oral Given 06/20/23 1250)  magnesium oxide (MAG-OX) tablet 800 mg (800 mg Oral Given 06/20/23 1250)  potassium chloride 10 mEq in 100 mL IVPB (0 mEq Intravenous Stopped 06/20/23 1350)  potassium chloride (KLOR-CON) packet 40 mEq (40 mEq Oral Given 06/20/23 1334)                                                                                                                                     Procedures .Critical Care  Performed by: Karlyn Overman, MD Authorized by: Karlyn Overman, MD   Critical care provider statement:    Critical care time (minutes):  30   Critical care was necessary to treat or prevent imminent or life-threatening deterioration of the following conditions:  Metabolic crisis   Critical care was time spent personally by me on the following activities:  Development of treatment plan with patient or surrogate, discussions with consultants, evaluation of patient's response to treatment, examination of patient, ordering and review of laboratory studies, ordering and review of radiographic studies, ordering and performing treatments and interventions, pulse oximetry, re-evaluation of patient's condition and review of old charts   (including critical care time)  Medical Decision Making / ED Course   This patient presents to the ED for concern of syncope, this  involves an extensive number of treatment options, and is a complaint that carries with it a high risk of complications and morbidity.  The differential diagnosis includes orthostatic syncope, cardiogenic syncope, vasovagal syncope, electrolyte abnormality, dehydration, dysrhythmia, vasovagal, Hypoglycemia, Seizure, Autonomic Insufficiency  MDM: Patient seen emergency room for evaluation of syncope.  Physical exam is largely unremarkable.  Laboratory evaluation with a hemoglobin of 10.6, critical hypokalemia to 2.4 which was aggressively repleted with oral and IV potassium as well as oral magnesium.  Patient fluid resuscitated and on potassium recheck this has significantly improved to 3.6.  Her ECG is nonischemic and does not show evidence of WPW or Brugada.  High-sensitivity troponin is unremarkable.  Patient presentation likely vasovagal syncope exacerbated by dehydration from diuresis and frequent diarrhea as well as hypokalemia with hypokalemia repleted she will continue to take her potassium at home with the medication her primary care physician sent in and will hydrate aggressively.  I encouraged her to follow-up with her gastroenterologist to discuss her recurrent diarrhea and to use the Imodium as prescribed by these providers.  At this time she does not meet inpatient criteria for admission and will be discharged with outpatient follow-up with return precautions of which she voiced understanding.   Additional history obtained: -Additional history  obtained from multiple family members -External records from outside source obtained and reviewed including: Chart review including previous notes, labs, imaging, consultation notes   Lab Tests: -I ordered, reviewed, and interpreted labs.   The pertinent results include:   Labs Reviewed  COMPREHENSIVE METABOLIC PANEL WITH GFR - Abnormal; Notable for the following components:      Result Value   Potassium 2.4 (*)    Glucose, Bld 127 (*)     Calcium 8.3 (*)    Total Protein 5.9 (*)    Albumin 3.3 (*)    GFR, Estimated 54 (*)    All other components within normal limits  CBC WITH DIFFERENTIAL/PLATELET - Abnormal; Notable for the following components:   RBC 3.49 (*)    Hemoglobin 10.6 (*)    HCT 31.8 (*)    All other components within normal limits  POTASSIUM  TROPONIN I (HIGH SENSITIVITY)      EKG   EKG Interpretation Date/Time:  Tuesday June 20 2023 11:31:46 EDT Ventricular Rate:  60 PR Interval:  193 QRS Duration:  101 QT Interval:  468 QTC Calculation: 468 R Axis:   72  Text Interpretation: Sinus rhythm Atrial premature complexes in couplets Confirmed by Zelie Asbill ([id number]) on 06/20/2023 11:32:49 AM         Medicines ordered and prescription drug management: Meds ordered this encounter  Medications   lactated ringers  bolus 1,000 mL   potassium chloride SA (KLOR-CON M) CR tablet 40 mEq   magnesium oxide (MAG-OX) tablet 800 mg   potassium chloride 10 mEq in 100 mL IVPB   potassium chloride (KLOR-CON) packet 40 mEq    -I have reviewed the patients home medicines and have made adjustments as needed  Critical interventions Electrolyte repletion    Cardiac Monitoring: The patient was maintained on a cardiac monitor.  I personally viewed and interpreted the cardiac monitored which showed an underlying rhythm of: NSr  Social Determinants of Health:  Factors impacting patients care include: none   Reevaluation: After the interventions noted above, I reevaluated the patient and found that they have :improved  Co morbidities that complicate the patient evaluation  Past Medical History:  Diagnosis Date   Arthritis    Complication of anesthesia    was told she had a small airway   Glaucoma    Hyperlipemia    Hypertension    TIA (transient ischemic attack)    had a finger go limp-was told tia.      Dispostion: I considered admission for this patient, but at this time she does not meet  inpatient criteria for admission and will be discharged with outpatient follow-up.     Final Clinical Impression(s) / ED Diagnoses Final diagnoses:  Syncope, unspecified syncope type  Hypokalemia     @PCDICTATION @    Karlyn Overman, MD 06/20/23 1851

## 2023-06-25 DIAGNOSIS — R194 Change in bowel habit: Secondary | ICD-10-CM

## 2023-06-25 DIAGNOSIS — R197 Diarrhea, unspecified: Secondary | ICD-10-CM

## 2023-06-26 ENCOUNTER — Ambulatory Visit: Payer: Self-pay

## 2023-06-26 NOTE — Telephone Encounter (Signed)
 Copied from CRM [id number]. Topic: Clinical - Red Word Triage >> Jun 26, 2023 10:19 AM Vena H wrote: [facility] that prompted transfer to Nurse Triage: Pt's daughter in law Andora Krull (not on DPR) states pt was seen in ED on 6/17 for Chronic Diarrhea, where they found out her potassium and hemoglobin was low, pt was also dehydrated. But [place] states pt is very weak and hasn't perked back up to her normal self yet and they are wanting her seen today if possible.    Reason for Disposition  [1] MODERATE diarrhea (e.g., 4-6 times / day more than normal) AND [2] present > 48 hours (2 days)    Appointment 6/25, patient will go to the ED if symptoms worsen  Answer Assessment - Initial Assessment Questions 1. DIARRHEA SEVERITY: How bad is the diarrhea? How many more stools have you had in the past 24 hours than normal?    - NO DIARRHEA (SCALE 0)   - MILD (SCALE 1-3): Few loose or mushy BMs; increase of 1-3 stools over normal daily number of stools; mild increase in ostomy output.   -  MODERATE (SCALE 4-7): Increase of 4-6 stools daily over normal; moderate increase in ostomy output.   -  SEVERE (SCALE 8-10; OR WORST POSSIBLE): Increase of 7 or more stools daily over normal; moderate increase in ostomy output; incontinence.     Moderate  2. ONSET: When did the diarrhea begin?      Over a week 3. BM CONSISTENCY: How loose or watery is the diarrhea?      Loose  4. VOMITING: Are you also vomiting? If Yes, ask: How many times in the past 24 hours?      No 5. ABDOMEN PAIN: Are you having any abdomen pain? If Yes, ask: What does it feel like? (e.g., crampy, dull, intermittent, constant)      No 6. ABDOMEN PAIN SEVERITY: If present, ask: How bad is the pain?  (e.g., Scale 1-10; mild, moderate, or severe)   - MILD (1-3): doesn't interfere with normal activities, abdomen soft and not tender to touch    - MODERATE (4-7): interferes with normal activities or awakens from sleep,  abdomen tender to touch    - SEVERE (8-10): excruciating pain, doubled over, unable to do any normal activities       0/10 7. ORAL INTAKE: If vomiting, Have you been able to drink liquids? How much liquids have you had in the past 24 hours?     Able to take fluids  8. HYDRATION: Any signs of dehydration? (e.g., dry mouth [not just dry lips], too weak to stand, dizziness, new weight loss) When did you last urinate?     Appears dehydrated  9. EXPOSURE: Have you traveled to a foreign country recently? Have you been exposed to anyone with diarrhea? Could you have eaten any food that was spoiled?     No 11. OTHER SYMPTOMS: Do you have any other symptoms? (e.g., fever, blood in stool)       Fatigue  Protocols used: Diarrhea-A-AH    FYI Only or Action Required?: FYI only for provider.  Patient was last seen in primary care on 06/16/2023 by Bevely Doffing, FNP. Called Nurse Triage reporting Diarrhea. Symptoms began over a week ago. Interventions attempted: Other: was seen in the ED. Symptoms are: unchanged.  Triage Disposition: See Physician Within 24 Hours Appointment 6/25, will go to ED if symptoms worsen  Patient/caregiver understands and will follow disposition?: Yes

## 2023-06-26 NOTE — Telephone Encounter (Signed)
Noted, patient scheduled.

## 2023-06-28 ENCOUNTER — Ambulatory Visit (INDEPENDENT_AMBULATORY_CARE_PROVIDER_SITE_OTHER): Payer: Self-pay

## 2023-06-28 DIAGNOSIS — E876 Hypokalemia: Secondary | ICD-10-CM

## 2023-06-28 DIAGNOSIS — I10 Essential (primary) hypertension: Secondary | ICD-10-CM | POA: Diagnosis not present

## 2023-06-28 DIAGNOSIS — R600 Localized edema: Secondary | ICD-10-CM | POA: Diagnosis not present

## 2023-06-28 NOTE — Progress Notes (Signed)
 Established Patient Office Visit  Subjective   Patient ID: Theresa Morris, female    DOB: October 19, 1931  Age: 89 y.o. MRN: [id number]  Chief Complaint  Patient presents with   Medical Management of Chronic Issues    Pt here for blood pressure and Diarrhea     HPI  Patient Active Problem List   Diagnosis Date Noted   Hypokalemia 06/30/2023   Change in stool habits 03/28/2023   Vitamin D  deficiency 02/07/2023   Proteinuria 08/22/2022   Low back pain 07/05/2022   Decreased hearing 06/22/2022   Neuropathic pain 06/07/2022   Edema of lower extremity 06/07/2022   Minimal cognitive impairment 02/11/2022   Hardening of the aorta (main artery of the heart) (HCC) 02/11/2022   Prediabetes 02/11/2022   Spinal stenosis of lumbosacral region 02/11/2022   Urinary frequency 02/08/2022   Raised intraocular pressure 08/11/2021   Mixed hyperlipidemia 02/10/2021   Hyponatremia 02/10/2021   Onychomycosis 08/26/2020   Essential hypertension 08/03/2020   Pain in joint of left shoulder 08/31/2017   PVD (peripheral vascular disease) (HCC) 11/05/2013     ROS    Objective:     BP (!) 153/67   Pulse 85   Ht 5' 1 (1.549 m)   Wt 113 lb 0.6 oz (51.3 kg)   SpO2 (!) 89%   BMI 21.36 kg/m  BP Readings from Last 3 Encounters:  06/28/23 (!) 153/67  06/20/23 122/68  06/16/23 (!) 151/64   Wt Readings from Last 3 Encounters:  06/28/23 113 lb 0.6 oz (51.3 kg)  06/20/23 114 lb 12.8 oz (52.1 kg)  06/16/23 114 lb 12.8 oz (52.1 kg)     Physical Exam Vitals and nursing note reviewed. Exam conducted with a chaperone present (here with her sister today.).  Constitutional:      Appearance: Normal appearance.   Eyes:     Extraocular Movements: Extraocular movements intact.     Pupils: Pupils are equal, round, and reactive to light.    Cardiovascular:     Rate and Rhythm: Normal rate and regular rhythm.  Pulmonary:     Effort: Pulmonary effort is normal.     Breath sounds: Normal  breath sounds.   Musculoskeletal:     Cervical back: Normal range of motion and neck supple.   Neurological:     Mental Status: She is alert and oriented to person, place, and time.   Psychiatric:        Mood and Affect: Mood normal.        Thought Content: Thought content normal.      No results found for any visits on 06/28/23.   The ASCVD Risk score (Arnett DK, et al., 2019) failed to calculate for the following reasons:   The 2019 ASCVD risk score is only valid for ages 62 to 15    Assessment & Plan:   Problem List Items Addressed This Visit       Cardiovascular and Mediastinum   Essential hypertension   Check BMP today as well.  Recommend follow-up in 3 months to recheck blood pressure.        Other   Edema of lower extremity   Improved with addition of hydrochlorothiazide .  Check BMP today.  Recommend f/u in 3 months or sooner if needed.       Hypokalemia   Potassium from ER on 6/17 was 3.6.  advised that she can hold potassium supplement for now, recommend increasing foods that are high in potassium.  No follow-ups on file.    Leita Longs, FNP

## 2023-06-30 DIAGNOSIS — E876 Hypokalemia: Secondary | ICD-10-CM | POA: Insufficient documentation

## 2023-06-30 NOTE — Assessment & Plan Note (Signed)
 Check BMP today as well.  Recommend follow-up in 3 months to recheck blood pressure.

## 2023-06-30 NOTE — Assessment & Plan Note (Signed)
 Potassium from ER on 6/17 was 3.6.  advised that she can hold potassium supplement for now, recommend increasing foods that are high in potassium.

## 2023-06-30 NOTE — Assessment & Plan Note (Signed)
 Improved with addition of hydrochlorothiazide .  Check BMP today.  Recommend f/u in 3 months or sooner if needed.

## 2023-07-03 ENCOUNTER — Other Ambulatory Visit: Payer: Self-pay | Admitting: Gastroenterology

## 2023-07-03 ENCOUNTER — Encounter: Payer: Self-pay | Admitting: Gastroenterology

## 2023-07-03 DIAGNOSIS — R197 Diarrhea, unspecified: Secondary | ICD-10-CM

## 2023-07-03 DIAGNOSIS — R630 Anorexia: Secondary | ICD-10-CM

## 2023-07-03 MED ORDER — CHOLESTYRAMINE LIGHT 4 GM/DOSE PO POWD: 4.0000 g | Freq: Every day | ORAL | 1 refills | Status: DC

## 2023-07-06 ENCOUNTER — Inpatient Hospital Stay: Payer: Self-pay | Admitting: Internal Medicine

## 2023-07-06 ENCOUNTER — Ambulatory Visit: Admitting: Internal Medicine

## 2023-07-12 ENCOUNTER — Ambulatory Visit (HOSPITAL_COMMUNITY)
Admission: RE | Admit: 2023-07-12 | Discharge: 2023-07-12 | Disposition: A | Discharge status: Home / Self Care | Source: Ambulatory Visit | Attending: Gastroenterology | Admitting: Gastroenterology

## 2023-07-12 DIAGNOSIS — R630 Anorexia: Secondary | ICD-10-CM | POA: Diagnosis not present

## 2023-07-12 DIAGNOSIS — R197 Diarrhea, unspecified: Secondary | ICD-10-CM | POA: Diagnosis not present

## 2023-07-12 DIAGNOSIS — J90 Pleural effusion, not elsewhere classified: Secondary | ICD-10-CM | POA: Diagnosis not present

## 2023-07-12 DIAGNOSIS — K529 Noninfective gastroenteritis and colitis, unspecified: Secondary | ICD-10-CM | POA: Diagnosis not present

## 2023-07-13 ENCOUNTER — Ambulatory Visit: Payer: Self-pay | Admitting: Gastroenterology

## 2023-07-13 DIAGNOSIS — R197 Diarrhea, unspecified: Secondary | ICD-10-CM | POA: Diagnosis not present

## 2023-07-15 LAB — CALPROTECTIN, FECAL: Calprotectin, Fecal: 2610 ug/g — AB (ref 0–120)

## 2023-07-15 LAB — C-REACTIVE PROTEIN: CRP: 4 mg/L (ref 0–10)

## 2023-07-16 LAB — COMPREHENSIVE METABOLIC PANEL WITH GFR
ALT: 9 IU/L (ref 0–32)
AST: 18 IU/L (ref 0–40)
Albumin: 3.9 g/dL (ref 3.6–4.6)
Alkaline Phosphatase: 107 IU/L (ref 44–121)
BUN/Creatinine Ratio: 15 (ref 12–28)
BUN: 13 mg/dL (ref 10–36)
Bilirubin Total: 0.3 mg/dL (ref 0.0–1.2)
CO2: 19 mmol/L — ABNORMAL LOW (ref 20–29)
Calcium: 8.7 mg/dL (ref 8.7–10.3)
Chloride: 104 mmol/L (ref 96–106)
Creatinine, Ser: 0.89 mg/dL (ref 0.57–1.00)
Globulin, Total: 2.1 g/dL (ref 1.5–4.5)
Glucose: 102 mg/dL — ABNORMAL HIGH (ref 70–99)
Potassium: 4 mmol/L (ref 3.5–5.2)
Sodium: 139 mmol/L (ref 134–144)
Total Protein: 6 g/dL (ref 6.0–8.5)
eGFR: 62 mL/min/1.73 (ref >59)

## 2023-07-16 LAB — CBC
Hematocrit: 35.8 % (ref 34.0–46.6)
Hemoglobin: 11.3 g/dL (ref 11.1–15.9)
MCH: 29.7 pg (ref 26.6–33.0)
MCHC: 31.6 g/dL (ref 31.5–35.7)
MCV: 94 fL (ref 79–97)
Platelets: 271 x10E3/uL (ref 150–450)
RBC: 3.8 x10E6/uL (ref 3.77–5.28)
RDW: 12.7 % (ref 11.7–15.4)
WBC: 7.5 x10E3/uL (ref 3.4–10.8)

## 2023-07-16 LAB — CELIAC DISEASE PANEL
IgA/Immunoglobulin A, Serum: 258 mg/dL (ref 64–422)
Transglutaminase IgA: 7 U/mL — AB (ref 0–3)

## 2023-07-17 DIAGNOSIS — M48062 Spinal stenosis, lumbar region with neurogenic claudication: Secondary | ICD-10-CM | POA: Diagnosis not present

## 2023-08-17 DIAGNOSIS — L820 Inflamed seborrheic keratosis: Secondary | ICD-10-CM | POA: Diagnosis not present

## 2023-08-25 ENCOUNTER — Ambulatory Visit: Admitting: Internal Medicine

## 2023-09-19 DIAGNOSIS — M5416 Radiculopathy, lumbar region: Secondary | ICD-10-CM | POA: Diagnosis not present

## 2023-10-17 ENCOUNTER — Encounter: Payer: Self-pay | Admitting: Internal Medicine

## 2023-10-17 ENCOUNTER — Telehealth: Payer: Self-pay | Admitting: *Deleted

## 2023-10-17 ENCOUNTER — Other Ambulatory Visit: Payer: Self-pay | Admitting: Gastroenterology

## 2023-10-17 ENCOUNTER — Ambulatory Visit: Admitting: Internal Medicine

## 2023-10-17 VITALS — BP 138/60 | HR 74 | Ht 61.0 in | Wt 115.0 lb

## 2023-10-17 DIAGNOSIS — Z0001 Encounter for general adult medical examination with abnormal findings: Secondary | ICD-10-CM

## 2023-10-17 DIAGNOSIS — K529 Noninfective gastroenteritis and colitis, unspecified: Secondary | ICD-10-CM

## 2023-10-17 DIAGNOSIS — Z23 Encounter for immunization: Secondary | ICD-10-CM | POA: Diagnosis not present

## 2023-10-17 DIAGNOSIS — E782 Mixed hyperlipidemia: Secondary | ICD-10-CM

## 2023-10-17 DIAGNOSIS — R197 Diarrhea, unspecified: Secondary | ICD-10-CM

## 2023-10-17 DIAGNOSIS — R739 Hyperglycemia, unspecified: Secondary | ICD-10-CM | POA: Diagnosis not present

## 2023-10-17 DIAGNOSIS — E559 Vitamin D deficiency, unspecified: Secondary | ICD-10-CM

## 2023-10-17 DIAGNOSIS — I10 Essential (primary) hypertension: Secondary | ICD-10-CM

## 2023-10-17 MED ORDER — CHOLESTYRAMINE LIGHT 4 GM/DOSE PO POWD: 4.0000 g | Freq: Every day | ORAL | 5 refills | Status: AC

## 2023-10-17 NOTE — Patient Instructions (Signed)
Please continue to take medications as prescribed.  Please continue to follow low salt diet and ambulate as tolerated.  Please get fasting blood tests done within a week.

## 2023-10-17 NOTE — Progress Notes (Signed)
 Established Patient Office Visit  Subjective:  Patient ID: Theresa Morris, female    DOB: 1931-07-21  Age: 89 y.o. MRN: [id number]  CC:  Chief Complaint  Patient presents with   Hypertension    Follow up    Diarrhea    Diarrhea for three days    HPI Theresa Morris is a 89 y.o. female with past medical history of HTN, HLD and prediabetes who presents for f/u of her chronic medical conditions.  Her daughter is present during the visit today.  HTN: Her BP is WNL.  She takes Amlodipine  5 mg QD, Coreg  12.5 mg BID and losartan  100 mg QD currently.  She has chronic leg swelling, but has been better recently.  Denies any headache, dizziness, chest pain, dyspnea or palpitations.  Diarrhea: She reports recent worsening of diarrhea for the last 3 days.  She has intermittent constipation and diarrhea, currently followed by GI.  She has cholestyramine  for chronic diarrhea.  Denies any melena or hematochezia.  She attributes her diarrhea to eating fruits.    Past Medical History:  Diagnosis Date   Arthritis    Complication of anesthesia    was told she had a small airway   Glaucoma    Hyperlipemia    Hypertension    TIA (transient ischemic attack)    had a finger go limp-was told tia.    Past Surgical History:  Procedure Laterality Date   CESAREAN SECTION     EYE SURGERY  2009   rt eye   TONSILLECTOMY     TRIGGER FINGER RELEASE  09/27/2011   Procedure: RELEASE TRIGGER FINGER/A-1 PULLEY;  Surgeon: Arley JONELLE Curia, MD;  Location: [facility];  Service: Orthopedics;  Laterality: Left;  Left middle finger    History reviewed. No pertinent family history.  Social History   Socioeconomic History   Marital status: Widowed    Spouse name: Not on file   Number of children: Not on file   Years of education: Not on file   Highest education level: Not on file  Occupational History   Not on file  Tobacco Use   Smoking status: Never   Smokeless tobacco: Never   Vaping Use   Vaping status: Never Used  Substance and Sexual Activity   Alcohol use: No   Drug use: No   Sexual activity: Not on file  Other Topics Concern   Not on file  Social History Narrative   Not on file   Social Drivers of Health   Financial Resource Strain: Not on file  Food Insecurity: Not on file  Transportation Needs: Not on file  Physical Activity: Not on file  Stress: Not on file  Social Connections: Not on file  Intimate Partner Violence: Not on file    Outpatient Medications Prior to Visit  Medication Sig Dispense Refill   amLODipine  (NORVASC ) 5 MG tablet Take 1 tablet (5 mg total) by mouth daily. 90 tablet 3   carvedilol  (COREG ) 12.5 MG tablet Take 1 tablet (12.5 mg total) by mouth 2 (two) times daily with a meal. 180 tablet 1   cholestyramine  light (PREVALITE ) 4 GM/DOSE powder Take 1 packet (4 g total) by mouth daily. Take at least 1 hour after morning medications and no other medications 4-6 hours after. Hold if no BM in 24 hours. 239.4 g 5   dorzolamide -timolol  (COSOPT ) 22.3-6.8 MG/ML ophthalmic solution Place 1 drop into both eyes 2 (two) times daily. 10 mL 2  gabapentin (NEURONTIN) 100 MG capsule Take 100 mg by mouth 3 (three) times daily.     losartan  (COZAAR ) 100 MG tablet TAKE ONE TABLET BY MOUTH EVERY DAY 90 tablet 1   meloxicam (MOBIC) 15 MG tablet Take 15 mg by mouth daily.     rosuvastatin (CRESTOR) 5 MG tablet Take 5 mg by mouth daily.     No facility-administered medications prior to visit.    No Known Allergies  ROS Review of Systems  Constitutional:  Negative for chills and fever.  HENT:  Negative for congestion, sinus pressure, sinus pain and sore throat.   Eyes:  Negative for pain and discharge.  Respiratory:  Negative for cough and shortness of breath.   Cardiovascular:  Negative for chest pain and palpitations.  Gastrointestinal:  Positive for diarrhea. Negative for abdominal pain, nausea and vomiting.  Endocrine: Negative for  polydipsia and polyuria.  Genitourinary:  Negative for dysuria and hematuria.  Musculoskeletal:  Negative for neck pain and neck stiffness.  Skin:  Negative for rash.  Neurological:  Negative for dizziness and weakness.  Psychiatric/Behavioral:  Negative for agitation and behavioral problems.       Objective:    Physical Exam Vitals reviewed.  Constitutional:      General: She is not in acute distress.    Appearance: She is not diaphoretic.  HENT:     Head: Normocephalic and atraumatic.     Nose: Nose normal.     Mouth/Throat:     Mouth: Mucous membranes are moist.  Eyes:     General: No scleral icterus.    Extraocular Movements: Extraocular movements intact.  Cardiovascular:     Rate and Rhythm: Normal rate and regular rhythm.     Heart sounds: Normal heart sounds. No murmur heard. Pulmonary:     Breath sounds: Normal breath sounds. No wheezing or rales.  Abdominal:     Tenderness: There is no abdominal tenderness.  Musculoskeletal:     Cervical back: Neck supple. No tenderness.     Right lower leg: Edema (Trace) present.     Left lower leg: Edema (Trace) present.  Skin:    General: Skin is warm.     Findings: No rash.  Neurological:     General: No focal deficit present.     Mental Status: She is alert and oriented to person, place, and time.     Cranial Nerves: No cranial nerve deficit.     Sensory: No sensory deficit.     Motor: No weakness.  Psychiatric:        Mood and Affect: Mood normal.        Behavior: Behavior normal.     BP 138/60 (BP Location: Left Arm)   Pulse 74   Ht 5' 1 (1.549 m)   Wt 115 lb (52.2 kg)   SpO2 95%   BMI 21.73 kg/m  Wt Readings from Last 3 Encounters:  10/17/23 115 lb (52.2 kg)  06/28/23 113 lb 0.6 oz (51.3 kg)  06/20/23 114 lb 12.8 oz (52.1 kg)    No results found for: TSH Lab Results  Component Value Date   WBC 7.5 07/13/2023   HGB 11.3 07/13/2023   HCT 35.8 07/13/2023   MCV 94 07/13/2023   PLT 271 07/13/2023    Lab Results  Component Value Date   NA 139 07/13/2023   K 4.0 07/13/2023   CO2 19 (L) 07/13/2023   GLUCOSE 102 (H) 07/13/2023   BUN 13 07/13/2023   CREATININE 0.89  07/13/2023   BILITOT 0.3 07/13/2023   ALKPHOS 107 07/13/2023   AST 18 07/13/2023   ALT 9 07/13/2023   PROT 6.0 07/13/2023   ALBUMIN 3.9 07/13/2023   CALCIUM 8.7 07/13/2023   ANIONGAP 10 06/20/2023   EGFR 62 07/13/2023   Lab Results  Component Value Date   CHOL 177 01/03/2023   Lab Results  Component Value Date   HDL 76 01/03/2023   Lab Results  Component Value Date   LDLCALC 89 01/03/2023   Lab Results  Component Value Date   TRIG 60 01/03/2023   Lab Results  Component Value Date   CHOLHDL 2.3 01/03/2023   No results found for: HGBA1C    Assessment & Plan:   Problem List Items Addressed This Visit       Cardiovascular and Mediastinum   Essential hypertension   BP Readings from Last 1 Encounters:  10/17/23 138/60   Well-controlled with amlodipine  5 mg QD, losartan  100 mg QD and carvedilol  12.5 mg BID Counseled for compliance with the medications Advised low salt diet and ambulate as tolerated       Relevant Orders   TSH   CMP14+EGFR   CBC with Differential/Platelet     Digestive   Chronic diarrhea   Likely has IBS, has alternating constipation as well Takes Cholestyramine  for diarrhea Advised to keep food diary, can try to to cut down dairy products Followed by GI         Other   Mixed hyperlipidemia (Chronic)   On Crestor 5 mg once daily Check lipid profile - plan to DC Crestor considering her age if LDL < 100      Relevant Orders   Lipid panel   Vitamin D  deficiency   Relevant Orders   Vitamin D  (25 hydroxy)   Encounter for general adult medical examination with abnormal findings - Primary   Physical exam as documented. Fasting blood tests today. PCV20 vaccine today. Denies flu vaccine. Advised to get Shingrix and Tdap vaccines at local pharmacy.      Other  Visit Diagnoses       Hyperglycemia       Relevant Orders   Hemoglobin A1c   CMP14+EGFR     Encounter for immunization       Relevant Orders   Pneumococcal conjugate vaccine 20-valent (Completed)       No orders of the defined types were placed in this encounter.   Follow-up: Return in about 6 months (around 04/16/2024) for HTN and HLD.    Theresa MARLA Blanch, MD

## 2023-10-17 NOTE — Assessment & Plan Note (Addendum)
 On Crestor 5 mg once daily Check lipid profile - plan to DC Crestor considering her age if LDL < 100

## 2023-10-17 NOTE — Assessment & Plan Note (Addendum)
 BP Readings from Last 1 Encounters:  10/17/23 138/60   Well-controlled with amlodipine  5 mg QD, losartan  100 mg QD and carvedilol  12.5 mg BID Counseled for compliance with the medications Advised low salt diet and ambulate as tolerated

## 2023-10-17 NOTE — Telephone Encounter (Signed)
 Received a refill request for cholestyramine . Pt last OV 04/10/2023

## 2023-10-17 NOTE — Assessment & Plan Note (Signed)
 Likely has IBS, has alternating constipation as well Takes Cholestyramine  for diarrhea Advised to keep food diary, can try to to cut down dairy products Followed by GI

## 2023-10-17 NOTE — Assessment & Plan Note (Addendum)
 Physical exam as documented. Fasting blood tests today. PCV20 vaccine today. Denies flu vaccine. Advised to get Shingrix and Tdap vaccines at local pharmacy.

## 2023-10-19 DIAGNOSIS — H04123 Dry eye syndrome of bilateral lacrimal glands: Secondary | ICD-10-CM | POA: Diagnosis not present

## 2023-10-20 ENCOUNTER — Ambulatory Visit: Admitting: Family Medicine

## 2023-10-24 DIAGNOSIS — E559 Vitamin D deficiency, unspecified: Secondary | ICD-10-CM | POA: Diagnosis not present

## 2023-10-24 DIAGNOSIS — E782 Mixed hyperlipidemia: Secondary | ICD-10-CM | POA: Diagnosis not present

## 2023-10-24 DIAGNOSIS — R739 Hyperglycemia, unspecified: Secondary | ICD-10-CM | POA: Diagnosis not present

## 2023-10-25 ENCOUNTER — Ambulatory Visit: Payer: Self-pay | Admitting: Internal Medicine

## 2023-10-25 LAB — CBC WITH DIFFERENTIAL/PLATELET
Basophils Absolute: 0.1 x10E3/uL (ref 0.0–0.2)
Basos: 1 %
EOS (ABSOLUTE): 0.2 x10E3/uL (ref 0.0–0.4)
Eos: 3 %
Hematocrit: 35.6 % (ref 34.0–46.6)
Hemoglobin: 11.2 g/dL (ref 11.1–15.9)
Immature Grans (Abs): 0 x10E3/uL (ref 0.0–0.1)
Immature Granulocytes: 0 %
Lymphocytes Absolute: 3 x10E3/uL (ref 0.7–3.1)
Lymphs: 36 %
MCH: 29.9 pg (ref 26.6–33.0)
MCHC: 31.5 g/dL (ref 31.5–35.7)
MCV: 95 fL (ref 79–97)
Monocytes Absolute: 0.7 x10E3/uL (ref 0.1–0.9)
Monocytes: 9 %
Neutrophils Absolute: 4.2 x10E3/uL (ref 1.4–7.0)
Neutrophils: 51 %
Platelets: 271 x10E3/uL (ref 150–450)
RBC: 3.75 x10E6/uL — ABNORMAL LOW (ref 3.77–5.28)
RDW: 12.1 % (ref 11.7–15.4)
WBC: 8.2 x10E3/uL (ref 3.4–10.8)

## 2023-10-25 LAB — HEMOGLOBIN A1C
Est. average glucose Bld gHb Est-mCnc: 105 mg/dL
Hgb A1c MFr Bld: 5.3 % (ref 4.8–5.6)

## 2023-10-25 LAB — CMP14+EGFR
ALT: 12 IU/L (ref 0–32)
AST: 15 IU/L (ref 0–40)
Albumin: 4.3 g/dL (ref 3.6–4.6)
Alkaline Phosphatase: 131 IU/L — ABNORMAL HIGH (ref 48–129)
BUN/Creatinine Ratio: 21 (ref 12–28)
BUN: 15 mg/dL (ref 10–36)
Bilirubin Total: 0.4 mg/dL (ref 0.0–1.2)
CO2: 22 mmol/L (ref 20–29)
Calcium: 9.6 mg/dL (ref 8.7–10.3)
Chloride: 106 mmol/L (ref 96–106)
Creatinine, Ser: 0.7 mg/dL (ref 0.57–1.00)
Globulin, Total: 2.1 g/dL (ref 1.5–4.5)
Glucose: 97 mg/dL (ref 70–99)
Potassium: 5.1 mmol/L (ref 3.5–5.2)
Sodium: 143 mmol/L (ref 134–144)
Total Protein: 6.4 g/dL (ref 6.0–8.5)
eGFR: 82 mL/min/1.73 (ref >59)

## 2023-10-25 LAB — LIPID PANEL
Chol/HDL Ratio: 2.4 ratio (ref 0.0–4.4)
Cholesterol, Total: 140 mg/dL (ref 100–199)
HDL: 58 mg/dL (ref >39)
LDL Chol Calc (NIH): 73 mg/dL (ref 0–99)
Triglycerides: 39 mg/dL (ref 0–149)
VLDL Cholesterol Cal: 9 mg/dL (ref 5–40)

## 2023-10-25 LAB — TSH: TSH: 2.15 u[IU]/mL (ref 0.450–4.500)

## 2023-10-25 LAB — VITAMIN D 25 HYDROXY (VIT D DEFICIENCY, FRACTURES): Vit D, 25-Hydroxy: 41.9 ng/mL (ref 30.0–100.0)

## 2023-11-07 ENCOUNTER — Ambulatory Visit

## 2023-11-10 ENCOUNTER — Other Ambulatory Visit: Payer: Self-pay | Admitting: Internal Medicine

## 2023-11-10 DIAGNOSIS — I10 Essential (primary) hypertension: Secondary | ICD-10-CM

## 2023-11-14 DIAGNOSIS — I129 Hypertensive chronic kidney disease with stage 1 through stage 4 chronic kidney disease, or unspecified chronic kidney disease: Secondary | ICD-10-CM | POA: Diagnosis not present

## 2023-11-14 DIAGNOSIS — H409 Unspecified glaucoma: Secondary | ICD-10-CM | POA: Diagnosis not present

## 2023-11-14 DIAGNOSIS — N1831 Chronic kidney disease, stage 3a: Secondary | ICD-10-CM | POA: Diagnosis not present

## 2023-11-14 DIAGNOSIS — Z791 Long term (current) use of non-steroidal anti-inflammatories (NSAID): Secondary | ICD-10-CM | POA: Diagnosis not present

## 2023-11-14 DIAGNOSIS — E785 Hyperlipidemia, unspecified: Secondary | ICD-10-CM | POA: Diagnosis not present

## 2023-11-14 DIAGNOSIS — M1990 Unspecified osteoarthritis, unspecified site: Secondary | ICD-10-CM | POA: Diagnosis not present

## 2023-11-14 DIAGNOSIS — R32 Unspecified urinary incontinence: Secondary | ICD-10-CM | POA: Diagnosis not present

## 2023-11-14 DIAGNOSIS — I700 Atherosclerosis of aorta: Secondary | ICD-10-CM | POA: Diagnosis not present

## 2023-11-14 DIAGNOSIS — G629 Polyneuropathy, unspecified: Secondary | ICD-10-CM | POA: Diagnosis not present

## 2023-11-15 ENCOUNTER — Other Ambulatory Visit: Payer: Self-pay

## 2023-12-14 ENCOUNTER — Encounter: Payer: Self-pay | Admitting: Internal Medicine

## 2023-12-14 ENCOUNTER — Other Ambulatory Visit: Payer: Self-pay | Admitting: Internal Medicine

## 2023-12-14 MED ORDER — GABAPENTIN 100 MG PO CAPS: 100.0000 mg | ORAL_CAPSULE | Freq: Three times a day (TID) | ORAL | 0 refills | Status: AC

## 2023-12-14 NOTE — Telephone Encounter (Signed)
 Copied from CRM #[id number]. Topic: Clinical - Medication Refill >> Dec 14, 2023  1:48 PM Ivette P wrote: Medication: gabapentin (NEURONTIN) 100 MG capsule  Has the patient contacted their pharmacy? Yes (Agent: If no, request that the patient contact the pharmacy for the refill. If patient does not wish to contact the pharmacy document the reason why and proceed with request.) (Agent: If yes, when and what did the pharmacy advise?)  This is the patient's preferred pharmacy:  [facility] [place], [place] - [id number] [place] [address] Phone: [phone] Fax: [phone]  Is this the correct pharmacy for this prescription? Yes If no, delete pharmacy and type the correct one.   Has the prescription been filled recently? No  Is the patient out of the medication? Yes  Has the patient been seen for an appointment in the last year OR does the patient have an upcoming appointment? Yes  Can we respond through MyChart? Yes  Agent: Please be advised that Rx refills may take up to 3 business days. We ask that you follow-up with your pharmacy.

## 2023-12-21 ENCOUNTER — Ambulatory Visit

## 2024-04-16 ENCOUNTER — Ambulatory Visit: Admitting: Internal Medicine
